# Patient Record
Sex: Female | Born: 1975 | Race: Black or African American | Hispanic: No | Marital: Married | Smoking: Current some day smoker
Health system: Southern US, Community
[De-identification: ages and names within clinical notes are randomized; demographics above are authoritative.]

## PROBLEM LIST (undated history)

## (undated) ENCOUNTER — Emergency Department (HOSPITAL_COMMUNITY): Admission: EM | Payer: PRIVATE HEALTH INSURANCE | Source: Home / Self Care

## (undated) DIAGNOSIS — S0285XA Fracture of orbit, unspecified, initial encounter for closed fracture: Secondary | ICD-10-CM

## (undated) DIAGNOSIS — E039 Hypothyroidism, unspecified: Secondary | ICD-10-CM

## (undated) HISTORY — PX: WISDOM TOOTH EXTRACTION: SHX21

---

## 2000-02-14 ENCOUNTER — Emergency Department (HOSPITAL_COMMUNITY): Admission: EM | Admit: 2000-02-14 | Discharge: 2000-02-14 | Payer: Self-pay | Admitting: Emergency Medicine

## 2000-02-17 ENCOUNTER — Emergency Department (HOSPITAL_COMMUNITY): Admission: EM | Admit: 2000-02-17 | Discharge: 2000-02-17 | Payer: Self-pay | Admitting: Emergency Medicine

## 2000-02-17 ENCOUNTER — Encounter: Payer: Self-pay | Admitting: Emergency Medicine

## 2001-02-06 ENCOUNTER — Emergency Department (HOSPITAL_COMMUNITY): Admission: EM | Admit: 2001-02-06 | Discharge: 2001-02-06 | Payer: Self-pay | Admitting: Emergency Medicine

## 2001-02-11 ENCOUNTER — Emergency Department (HOSPITAL_COMMUNITY): Admission: EM | Admit: 2001-02-11 | Discharge: 2001-02-12 | Payer: Self-pay | Admitting: Emergency Medicine

## 2002-11-23 ENCOUNTER — Emergency Department (HOSPITAL_COMMUNITY): Admission: EM | Admit: 2002-11-23 | Discharge: 2002-11-23 | Payer: Self-pay | Admitting: Emergency Medicine

## 2002-12-30 ENCOUNTER — Encounter: Admission: RE | Admit: 2002-12-30 | Discharge: 2003-01-27 | Payer: Self-pay | Admitting: Family Medicine

## 2002-12-30 ENCOUNTER — Encounter: Admission: RE | Admit: 2002-12-30 | Discharge: 2002-12-30 | Payer: Self-pay | Admitting: Family Medicine

## 2002-12-30 ENCOUNTER — Encounter: Payer: Self-pay | Admitting: Family Medicine

## 2003-03-13 ENCOUNTER — Emergency Department (HOSPITAL_COMMUNITY): Admission: EM | Admit: 2003-03-13 | Discharge: 2003-03-13 | Payer: Self-pay | Admitting: Emergency Medicine

## 2003-03-13 ENCOUNTER — Encounter: Payer: Self-pay | Admitting: Emergency Medicine

## 2004-08-28 ENCOUNTER — Emergency Department (HOSPITAL_COMMUNITY): Admission: AC | Admit: 2004-08-28 | Discharge: 2004-08-28 | Payer: Self-pay

## 2005-08-07 ENCOUNTER — Emergency Department (HOSPITAL_COMMUNITY): Admission: EM | Admit: 2005-08-07 | Discharge: 2005-08-07 | Payer: Self-pay | Admitting: Emergency Medicine

## 2005-09-29 ENCOUNTER — Emergency Department (HOSPITAL_COMMUNITY): Admission: EM | Admit: 2005-09-29 | Discharge: 2005-09-29 | Payer: Self-pay | Admitting: Family Medicine

## 2005-10-23 ENCOUNTER — Encounter (HOSPITAL_COMMUNITY): Admission: RE | Admit: 2005-10-23 | Discharge: 2005-12-19 | Payer: Self-pay | Admitting: Endocrinology

## 2005-11-03 ENCOUNTER — Ambulatory Visit (HOSPITAL_COMMUNITY): Admission: RE | Admit: 2005-11-03 | Discharge: 2005-11-03 | Payer: Self-pay | Admitting: Endocrinology

## 2006-07-05 ENCOUNTER — Emergency Department (HOSPITAL_COMMUNITY): Admission: EM | Admit: 2006-07-05 | Discharge: 2006-07-05 | Payer: Self-pay | Admitting: Emergency Medicine

## 2007-03-31 ENCOUNTER — Emergency Department (HOSPITAL_COMMUNITY): Admission: EM | Admit: 2007-03-31 | Discharge: 2007-03-31 | Payer: Self-pay | Admitting: Emergency Medicine

## 2009-01-17 ENCOUNTER — Emergency Department (HOSPITAL_COMMUNITY): Admission: EM | Admit: 2009-01-17 | Discharge: 2009-01-17 | Payer: Self-pay | Admitting: Emergency Medicine

## 2010-09-05 ENCOUNTER — Emergency Department (HOSPITAL_COMMUNITY)
Admission: EM | Admit: 2010-09-05 | Discharge: 2010-09-05 | Payer: Self-pay | Source: Home / Self Care | Admitting: Emergency Medicine

## 2010-11-21 LAB — URINALYSIS, ROUTINE W REFLEX MICROSCOPIC
Bilirubin Urine: NEGATIVE
Glucose, UA: NEGATIVE mg/dL
Ketones, ur: NEGATIVE mg/dL
Leukocytes, UA: NEGATIVE
Nitrite: NEGATIVE
Protein, ur: NEGATIVE mg/dL
Specific Gravity, Urine: 1.025 (ref 1.005–1.030)
Urobilinogen, UA: 0.2 mg/dL (ref 0.0–1.0)
pH: 6.5 (ref 5.0–8.0)

## 2010-11-21 LAB — CBC
HCT: 37.5 % (ref 36.0–46.0)
Hemoglobin: 12.2 g/dL (ref 12.0–15.0)
MCH: 29.8 pg (ref 26.0–34.0)
MCHC: 32.5 g/dL (ref 30.0–36.0)
MCV: 91.5 fL (ref 78.0–100.0)
Platelets: 210 10*3/uL (ref 150–400)
RBC: 4.1 MIL/uL (ref 3.87–5.11)
RDW: 15.6 % — ABNORMAL HIGH (ref 11.5–15.5)
WBC: 9.6 10*3/uL (ref 4.0–10.5)

## 2010-11-21 LAB — COMPREHENSIVE METABOLIC PANEL
ALT: 21 U/L (ref 0–35)
AST: 33 U/L (ref 0–37)
Albumin: 4.7 g/dL (ref 3.5–5.2)
Alkaline Phosphatase: 55 U/L (ref 39–117)
BUN: 8 mg/dL (ref 6–23)
CO2: 25 mEq/L (ref 19–32)
Calcium: 9.5 mg/dL (ref 8.4–10.5)
Chloride: 102 mEq/L (ref 96–112)
Creatinine, Ser: 0.85 mg/dL (ref 0.4–1.2)
GFR calc Af Amer: >60 mL/min (ref >60)
GFR calc non Af Amer: >60 mL/min (ref >60)
Glucose, Bld: 155 mg/dL — ABNORMAL HIGH (ref 70–99)
Potassium: 3.5 mEq/L (ref 3.5–5.1)
Sodium: 141 mEq/L (ref 135–145)
Total Bilirubin: 0.8 mg/dL (ref 0.3–1.2)
Total Protein: 8.6 g/dL — ABNORMAL HIGH (ref 6.0–8.3)

## 2010-11-21 LAB — URINE MICROSCOPIC-ADD ON

## 2010-11-21 LAB — DIFFERENTIAL
Basophils Absolute: 0 10*3/uL (ref 0.0–0.1)
Basophils Relative: 0 % (ref 0–1)
Eosinophils Absolute: 0 10*3/uL (ref 0.0–0.7)
Eosinophils Relative: 0 % (ref 0–5)
Lymphocytes Relative: 7 % — ABNORMAL LOW (ref 12–46)
Lymphs Abs: 0.7 10*3/uL (ref 0.7–4.0)
Monocytes Absolute: 0.7 10*3/uL (ref 0.1–1.0)
Monocytes Relative: 7 % (ref 3–12)
Neutro Abs: 8.2 10*3/uL — ABNORMAL HIGH (ref 1.7–7.7)
Neutrophils Relative %: 86 % — ABNORMAL HIGH (ref 43–77)

## 2010-11-21 LAB — LIPASE, BLOOD: Lipase: 23 U/L (ref 11–59)

## 2010-11-21 LAB — POCT PREGNANCY, URINE: Preg Test, Ur: NEGATIVE

## 2011-01-05 ENCOUNTER — Emergency Department (HOSPITAL_COMMUNITY)
Admission: EM | Admit: 2011-01-05 | Discharge: 2011-01-06 | Disposition: A | Discharge status: Home / Self Care | Payer: Self-pay | Attending: Emergency Medicine | Admitting: Emergency Medicine

## 2011-01-05 DIAGNOSIS — R109 Unspecified abdominal pain: Secondary | ICD-10-CM | POA: Insufficient documentation

## 2011-01-05 DIAGNOSIS — R112 Nausea with vomiting, unspecified: Secondary | ICD-10-CM | POA: Insufficient documentation

## 2011-01-05 DIAGNOSIS — R197 Diarrhea, unspecified: Secondary | ICD-10-CM | POA: Insufficient documentation

## 2011-01-05 LAB — URINALYSIS, ROUTINE W REFLEX MICROSCOPIC
Bilirubin Urine: NEGATIVE
Glucose, UA: NEGATIVE mg/dL
Leukocytes, UA: NEGATIVE
Protein, ur: 100 mg/dL — AB
Specific Gravity, Urine: 1.026 (ref 1.005–1.030)
pH: 6 (ref 5.0–8.0)

## 2011-01-05 LAB — URINE MICROSCOPIC-ADD ON

## 2011-01-05 LAB — POCT PREGNANCY, URINE: Preg Test, Ur: NEGATIVE

## 2011-01-06 LAB — COMPREHENSIVE METABOLIC PANEL
ALT: 19 U/L (ref 0–35)
AST: 25 U/L (ref 0–37)
Albumin: 4 g/dL (ref 3.5–5.2)
Alkaline Phosphatase: 50 U/L (ref 39–117)
BUN: 7 mg/dL (ref 6–23)
Calcium: 8.2 mg/dL — ABNORMAL LOW (ref 8.4–10.5)
Chloride: 110 mEq/L (ref 96–112)
Creatinine, Ser: 0.75 mg/dL (ref 0.4–1.2)
GFR calc Af Amer: >60 mL/min (ref >60)
Glucose, Bld: 116 mg/dL — ABNORMAL HIGH (ref 70–99)
Potassium: 4.1 mEq/L (ref 3.5–5.1)
Sodium: 138 mEq/L (ref 135–145)
Total Protein: 7 g/dL (ref 6.0–8.3)

## 2011-01-06 LAB — DIFFERENTIAL
Basophils Relative: 0 % (ref 0–1)
Eosinophils Absolute: 0 10*3/uL (ref 0.0–0.7)
Eosinophils Relative: 0 % (ref 0–5)
Lymphocytes Relative: 7 % — ABNORMAL LOW (ref 12–46)
Lymphs Abs: 0.5 10*3/uL — ABNORMAL LOW (ref 0.7–4.0)
Monocytes Absolute: 0.4 10*3/uL (ref 0.1–1.0)
Monocytes Relative: 5 % (ref 3–12)
Neutro Abs: 7.1 10*3/uL (ref 1.7–7.7)
Neutrophils Relative %: 88 % — ABNORMAL HIGH (ref 43–77)

## 2011-01-06 LAB — LIPASE, BLOOD: Lipase: 26 U/L (ref 11–59)

## 2011-01-06 LAB — CBC
HCT: 34.2 % — ABNORMAL LOW (ref 36.0–46.0)
MCHC: 33.6 g/dL (ref 30.0–36.0)
MCV: 93.7 fL (ref 78.0–100.0)
Platelets: 230 10*3/uL (ref 150–400)
RDW: 13.5 % (ref 11.5–15.5)
WBC: 8 10*3/uL (ref 4.0–10.5)

## 2011-03-29 ENCOUNTER — Emergency Department (HOSPITAL_COMMUNITY)
Admission: EM | Admit: 2011-03-29 | Discharge: 2011-03-29 | Disposition: A | Discharge status: Home / Self Care | Payer: Self-pay | Attending: Emergency Medicine | Admitting: Emergency Medicine

## 2011-03-29 DIAGNOSIS — E860 Dehydration: Secondary | ICD-10-CM | POA: Insufficient documentation

## 2011-03-29 DIAGNOSIS — R1013 Epigastric pain: Secondary | ICD-10-CM | POA: Insufficient documentation

## 2011-03-29 DIAGNOSIS — R112 Nausea with vomiting, unspecified: Secondary | ICD-10-CM | POA: Insufficient documentation

## 2011-03-29 DIAGNOSIS — K5289 Other specified noninfective gastroenteritis and colitis: Secondary | ICD-10-CM | POA: Insufficient documentation

## 2011-03-29 DIAGNOSIS — R197 Diarrhea, unspecified: Secondary | ICD-10-CM | POA: Insufficient documentation

## 2011-03-29 LAB — DIFFERENTIAL
Basophils Absolute: 0 10*3/uL (ref 0.0–0.1)
Basophils Relative: 0 % (ref 0–1)
Eosinophils Absolute: 0 10*3/uL (ref 0.0–0.7)
Eosinophils Relative: 0 % (ref 0–5)
Lymphocytes Relative: 8 % — ABNORMAL LOW (ref 12–46)
Lymphs Abs: 0.8 10*3/uL (ref 0.7–4.0)
Monocytes Absolute: 0.4 10*3/uL (ref 0.1–1.0)
Monocytes Relative: 4 % (ref 3–12)
Neutrophils Relative %: 88 % — ABNORMAL HIGH (ref 43–77)

## 2011-03-29 LAB — URINALYSIS, ROUTINE W REFLEX MICROSCOPIC
Bilirubin Urine: NEGATIVE
Glucose, UA: NEGATIVE mg/dL
Ketones, ur: NEGATIVE mg/dL
Leukocytes, UA: NEGATIVE
Nitrite: NEGATIVE
Specific Gravity, Urine: 1.025 (ref 1.005–1.030)
Urobilinogen, UA: 0.2 mg/dL (ref 0.0–1.0)
pH: 7 (ref 5.0–8.0)

## 2011-03-29 LAB — LIPASE, BLOOD: Lipase: 10 U/L — ABNORMAL LOW (ref 11–59)

## 2011-03-29 LAB — COMPREHENSIVE METABOLIC PANEL
AST: 36 U/L (ref 0–37)
Albumin: 4.7 g/dL (ref 3.5–5.2)
BUN: 8 mg/dL (ref 6–23)
Chloride: 101 mEq/L (ref 96–112)
Creatinine, Ser: 0.65 mg/dL (ref 0.50–1.10)
Total Protein: 8.9 g/dL — ABNORMAL HIGH (ref 6.0–8.3)

## 2011-03-29 LAB — CBC
HCT: 36.7 % (ref 36.0–46.0)
MCHC: 34.1 g/dL (ref 30.0–36.0)
MCV: 92.7 fL (ref 78.0–100.0)
Platelets: 340 10*3/uL (ref 150–400)
RDW: 13.7 % (ref 11.5–15.5)
WBC: 9.7 10*3/uL (ref 4.0–10.5)

## 2011-03-29 LAB — URINE MICROSCOPIC-ADD ON

## 2011-03-29 LAB — POCT PREGNANCY, URINE: Preg Test, Ur: NEGATIVE

## 2011-09-20 ENCOUNTER — Ambulatory Visit: Payer: Self-pay

## 2011-11-15 ENCOUNTER — Encounter (HOSPITAL_COMMUNITY): Payer: Self-pay | Admitting: Emergency Medicine

## 2011-11-15 ENCOUNTER — Emergency Department (HOSPITAL_COMMUNITY)
Admission: EM | Admit: 2011-11-15 | Discharge: 2011-11-15 | Disposition: A | Discharge status: Home / Self Care | Payer: Self-pay | Attending: Emergency Medicine | Admitting: Emergency Medicine

## 2011-11-15 DIAGNOSIS — R112 Nausea with vomiting, unspecified: Secondary | ICD-10-CM | POA: Insufficient documentation

## 2011-11-15 DIAGNOSIS — R197 Diarrhea, unspecified: Secondary | ICD-10-CM | POA: Insufficient documentation

## 2011-11-15 DIAGNOSIS — R109 Unspecified abdominal pain: Secondary | ICD-10-CM | POA: Insufficient documentation

## 2011-11-15 LAB — DIFFERENTIAL
Basophils Absolute: 0 10*3/uL (ref 0.0–0.1)
Basophils Relative: 0 % (ref 0–1)
Eosinophils Absolute: 0 10*3/uL (ref 0.0–0.7)
Eosinophils Relative: 0 % (ref 0–5)
Lymphocytes Relative: 10 % — ABNORMAL LOW (ref 12–46)
Lymphs Abs: 0.7 10*3/uL (ref 0.7–4.0)
Monocytes Absolute: 0.4 10*3/uL (ref 0.1–1.0)
Monocytes Relative: 6 % (ref 3–12)
Neutro Abs: 5.9 10*3/uL (ref 1.7–7.7)
Neutrophils Relative %: 85 % — ABNORMAL HIGH (ref 43–77)

## 2011-11-15 LAB — BASIC METABOLIC PANEL
BUN: 13 mg/dL (ref 6–23)
CO2: 23 mEq/L (ref 19–32)
Calcium: 10.2 mg/dL (ref 8.4–10.5)
Chloride: 101 mEq/L (ref 96–112)
Creatinine, Ser: 0.73 mg/dL (ref 0.50–1.10)
GFR calc Af Amer: >90 mL/min (ref >90)
GFR calc non Af Amer: >90 mL/min (ref >90)
Glucose, Bld: 125 mg/dL — ABNORMAL HIGH (ref 70–99)
Potassium: 4.1 mEq/L (ref 3.5–5.1)
Sodium: 140 mEq/L (ref 135–145)

## 2011-11-15 LAB — URINALYSIS, ROUTINE W REFLEX MICROSCOPIC
Bilirubin Urine: NEGATIVE
Glucose, UA: NEGATIVE mg/dL
Ketones, ur: 40 mg/dL — AB
Leukocytes, UA: NEGATIVE
Nitrite: NEGATIVE
Protein, ur: >300 mg/dL — AB
Specific Gravity, Urine: 1.027 (ref 1.005–1.030)
Urobilinogen, UA: 0.2 mg/dL (ref 0.0–1.0)
pH: 6 (ref 5.0–8.0)

## 2011-11-15 LAB — CBC
HCT: 37.6 % (ref 36.0–46.0)
Hemoglobin: 12.7 g/dL (ref 12.0–15.0)
MCH: 31.7 pg (ref 26.0–34.0)
MCHC: 33.8 g/dL (ref 30.0–36.0)
MCV: 93.8 fL (ref 78.0–100.0)
Platelets: 232 10*3/uL (ref 150–400)
RBC: 4.01 MIL/uL (ref 3.87–5.11)
RDW: 13.7 % (ref 11.5–15.5)
WBC: 7 10*3/uL (ref 4.0–10.5)

## 2011-11-15 LAB — URINE MICROSCOPIC-ADD ON

## 2011-11-15 LAB — POCT PREGNANCY, URINE: Preg Test, Ur: NEGATIVE

## 2011-11-15 MED ORDER — ONDANSETRON HCL 4 MG/2ML IJ SOLN
4.0000 mg | Freq: Once | INTRAMUSCULAR | Status: AC
  Administered 2011-11-15: 4 mg via INTRAVENOUS
  Filled 2011-11-15: qty 2

## 2011-11-15 MED ORDER — SODIUM CHLORIDE 0.9 % IV BOLUS (SEPSIS)
1000.0000 mL | Freq: Once | INTRAVENOUS | Status: AC
  Administered 2011-11-15: 1000 mL via INTRAVENOUS

## 2011-11-15 MED ORDER — MORPHINE SULFATE 4 MG/ML IJ SOLN
INTRAMUSCULAR | Status: AC
  Administered 2011-11-15: 4 mg via INTRAVENOUS
  Filled 2011-11-15: qty 1

## 2011-11-15 MED ORDER — MORPHINE SULFATE 4 MG/ML IJ SOLN
4.0000 mg | Freq: Once | INTRAMUSCULAR | Status: AC
  Administered 2011-11-15: 4 mg via INTRAVENOUS

## 2011-11-15 MED ORDER — ONDANSETRON HCL 4 MG PO TABS: 4.0000 mg | ORAL_TABLET | Freq: Four times a day (QID) | ORAL | Status: AC

## 2011-11-15 NOTE — Discharge Instructions (Signed)
B.R.A.T. Diet Your doctor has recommended the B.R.A.T. diet for you or your child until the condition improves. This is often used to help control diarrhea and vomiting symptoms. If you or your child can tolerate clear liquids, you may have:  Bananas.   Rice.   Applesauce.   Toast (and other simple starches such as crackers, potatoes, noodles).  Be sure to avoid dairy products, meats, and fatty foods until symptoms are better. Fruit juices such as apple, grape, and prune juice can make diarrhea worse. Avoid these. Continue this diet for 2 days or as instructed by your caregiver.Nausea and Vomiting Nausea is a sick feeling that often comes before throwing up (vomiting). Vomiting is a reflex where stomach contents come out of your mouth. Vomiting can cause severe loss of body fluids (dehydration). Children and elderly adults can become dehydrated quickly, especially if they also have diarrhea. Nausea and vomiting are symptoms of a condition or disease. It is important to find the cause of your symptoms. CAUSES   Direct irritation of the stomach lining. This irritation can result from increased acid production (gastroesophageal reflux disease), infection, food poisoning, taking certain medicines (such as nonsteroidal anti-inflammatory drugs), alcohol use, or tobacco use.   Signals from the brain.These signals could be caused by a headache, heat exposure, an inner ear disturbance, increased pressure in the brain from injury, infection, a tumor, or a concussion, pain, emotional stimulus, or metabolic problems.   An obstruction in the gastrointestinal tract (bowel obstruction).   Illnesses such as diabetes, hepatitis, gallbladder problems, appendicitis, kidney problems, cancer, sepsis, atypical symptoms of a heart attack, or eating disorders.   Medical treatments such as chemotherapy and radiation.   Receiving medicine that makes you sleep (general anesthetic) during surgery.  DIAGNOSIS Your  caregiver may ask for tests to be done if the problems do not improve after a few days. Tests may also be done if symptoms are severe or if the reason for the nausea and vomiting is not clear. Tests may include:  Urine tests.   Blood tests.   Stool tests.   Cultures (to look for evidence of infection).   X-rays or other imaging studies.  Test results can help your caregiver make decisions about treatment or the need for additional tests. TREATMENT You need to stay well hydrated. Drink frequently but in small amounts.You may wish to drink water, sports drinks, clear broth, or eat frozen ice pops or gelatin dessert to help stay hydrated.When you eat, eating slowly may help prevent nausea.There are also some antinausea medicines that may help prevent nausea. HOME CARE INSTRUCTIONS   Take all medicine as directed by your caregiver.   If you do not have an appetite, do not force yourself to eat. However, you must continue to drink fluids.   If you have an appetite, eat a normal diet unless your caregiver tells you differently.   Eat a variety of complex carbohydrates (rice, wheat, potatoes, bread), lean meats, yogurt, fruits, and vegetables.   Avoid high-fat foods because they are more difficult to digest.   Drink enough water and fluids to keep your urine clear or pale yellow.   If you are dehydrated, ask your caregiver for specific rehydration instructions. Signs of dehydration may include:   Severe thirst.   Dry lips and mouth.   Dizziness.   Dark urine.   Decreasing urine frequency and amount.   Confusion.   Rapid breathing or pulse.  SEEK IMMEDIATE MEDICAL CARE IF:   You have  blood or brown flecks (like coffee grounds) in your vomit.   You have black or bloody stools.   You have a severe headache or stiff neck.   You are confused.   You have severe abdominal pain.   You have chest pain or trouble breathing.   You do not urinate at least once every 8 hours.     You develop cold or clammy skin.   You continue to vomit for longer than 24 to 48 hours.   You have a fever.  MAKE SURE YOU:   Understand these instructions.   Will watch your condition.   Will get help right away if you are not doing well or get worse.  Document Released: 08/28/2005 Document Revised: 08/17/2011 Document Reviewed: 01/25/2011 [facility] Patient Information 2012 [place], [place].  RESOURCE GUIDE  Dental Problems  Patients with Medicaid: [facility] [phone] [address]                                           [phone] [address] Phone:  [phone]                                                  Phone:  [phone]  If unable to pay or uninsured, contact:  [facility] or [facility]. to become qualified for the adult dental clinic.  Chronic Pain Problems Contact [facility]  [phone] Patients need to be referred by their primary care doctor.  Insufficient Money for Medicine Contact [employer]:  call "211" or [facility] [phone].  No Primary Care Doctor Call [facility]  8310303065 Other agencies that provide inexpensive medical care    [facility]  [phone]    [facility]  [phone]    [facility]  [phone]    [facility]  [phone]    [facility]  [phone]    [facility]  [phone]  Psychological Services [facility]  [phone] [facility] Services  [phone] [facility]   [phone] (emergency services [phone])  Substance Abuse Resources [facility]  7127924066 [facility] [phone] [facility] [phone] Floydene Flock [phone] Residential & Outpatient Substance Abuse Program  [phone]  Abuse/Neglect [facility] Child Abuse Hotline 671-078-1431 [facility] Child Abuse Hotline 864-113-2738 (After  Hours)  Emergency Shelter [facility] ([phone]  Maternity Homes [facility] [place] [facility] [place] 8574002829 [facility] [phone]  MRSA Hotline #:   (928)616-4353    [facility] Resources  [facility]     [employer]                          [facility] [address] [place]                       [address]      [address]  [address]  Cristobal Goldmann Phone:  [phone]                                   Phone:  [phone]                 Phone:  [phone]  [facility] Mental Health Phone:  [phone]  [facility] Child Abuse Hotline 463-494-0057 [phone] (After Hours)   Document Released: 08/28/2005 Document Revised: 08/17/2011 Document Reviewed: 02/14/2007 [facility] Patient Information 2012 [place], [place].

## 2011-11-15 NOTE — ED Notes (Signed)
PT. REPORTS PERSISTENT VOMITTING WITH DIARRHEA AND MID ABDOMINAL CRAMPING ONSET THIS MORNING , DENIES FEVER OR CHILLS.

## 2011-11-15 NOTE — ED Notes (Signed)
Pt reports feeling better. No further emesis. Pain improved. Resting with eyes closed. S/o at bedside. Will continue to monitor.

## 2011-11-20 NOTE — ED Provider Notes (Signed)
History    36 year old female with vomiting and diarrhea. Onset of symptoms was morning. Nonbilious, nonbloody emesis and no blood in her stool. Mild diffuse crampy abdominal pain. No radiation. No appreciable aggravating or relieving factors. No sick contacts. No urinary complaints. Denies history of abdominal or pelvic surgery. No fevers or chills.     CSN: [id number]  Arrival date & time 11/15/11  0019   First MD Initiated Contact with Patient 11/15/11 0054      Chief Complaint  Patient presents with  . Emesis    (Consider location/radiation/quality/duration/timing/severity/associated sxs/prior treatment) HPI  History reviewed. No pertinent past medical history.  History reviewed. No pertinent past surgical history.  No family history on file.  History  Substance Use Topics  . Smoking status: Never Smoker   . Smokeless tobacco: Not on file  . Alcohol Use: Yes    OB History    Grav Para Term Preterm Abortions TAB SAB Ect Mult Living                  Review of Systems   Review of symptoms negative unless otherwise noted in HPI.   Allergies  Review of patient's allergies indicates no known allergies.  Home Medications   Current Outpatient Rx  Name Route Sig Dispense Refill  . ONDANSETRON HCL 4 MG PO TABS Oral Take 1 tablet (4 mg total) by mouth every 6 (six) hours. 12 tablet 0    BP 180/82  Pulse 58  Temp(Src) 98.7 F (37.1 C) (Oral)  Resp 19  SpO2 97%  LMP 11/07/2011  Physical Exam  Nursing note and vitals reviewed. Constitutional: She appears well-developed and well-nourished. No distress.       Sitting up in bed. No acute distress  HENT:  Head: Normocephalic and atraumatic.  Right Ear: External ear normal.  Left Ear: External ear normal.  Eyes: Conjunctivae are normal. Right eye exhibits no discharge. Left eye exhibits no discharge.  Neck: Neck supple.  Cardiovascular: Normal rate, regular rhythm and normal heart sounds.  Exam reveals no  gallop and no friction rub.   No murmur heard. Pulmonary/Chest: Effort normal and breath sounds normal. No respiratory distress.  Abdominal: Soft. She exhibits no distension. There is no tenderness.       Abdomen normal limits to inspection. There is no distention. Mild diffuse tenderness without guarding. There is no rebound tenderness.  Genitourinary:       No costovertebral angle tenderness  Musculoskeletal: She exhibits no edema and no tenderness.  Neurological: She is alert.  Skin: Skin is warm and dry. She is not diaphoretic.  Psychiatric: She has a normal mood and affect. Her behavior is normal. Thought content normal.    ED Course  Procedures (including critical care time)  Labs Reviewed  URINALYSIS, ROUTINE W REFLEX MICROSCOPIC - Abnormal; Notable for the following:    Hgb urine dipstick TRACE (*)    Ketones, ur 40 (*)    Protein, ur >300 (*)    All other components within normal limits  DIFFERENTIAL - Abnormal; Notable for the following:    Neutrophils Relative 85 (*)    Lymphocytes Relative 10 (*)    All other components within normal limits  BASIC METABOLIC PANEL - Abnormal; Notable for the following:    Glucose, Bld 125 (*)    All other components within normal limits  URINE MICROSCOPIC-ADD ON - Abnormal; Notable for the following:    Squamous Epithelial / LPF FEW (*)    Bacteria, UA  FEW (*)    All other components within normal limits  CBC  POCT PREGNANCY, URINE  LAB REPORT - SCANNED   No results found.   1. Nausea and vomiting in adult       MDM  36 year old female with vomiting and diarrhea. Benign abdominal examination. Symptom duration less than 24 hours and a relatively unremarkable workup. Patient reports symptoms much improved after IV fluids and antiemetics. Very low suspicion for surgical abdomen. Suspect viral illness. Return precautions were discussed. Plan continued symptomatic treatment. Outpatient followup as needed.       Raeford Razor, MD 11/20/11 1655

## 2014-01-07 ENCOUNTER — Encounter (HOSPITAL_COMMUNITY): Payer: Self-pay | Admitting: Emergency Medicine

## 2014-01-07 DIAGNOSIS — R1084 Generalized abdominal pain: Secondary | ICD-10-CM | POA: Insufficient documentation

## 2014-01-07 DIAGNOSIS — R030 Elevated blood-pressure reading, without diagnosis of hypertension: Secondary | ICD-10-CM | POA: Insufficient documentation

## 2014-01-07 DIAGNOSIS — M545 Low back pain, unspecified: Secondary | ICD-10-CM | POA: Insufficient documentation

## 2014-01-07 DIAGNOSIS — Z79899 Other long term (current) drug therapy: Secondary | ICD-10-CM | POA: Insufficient documentation

## 2014-01-07 DIAGNOSIS — R112 Nausea with vomiting, unspecified: Secondary | ICD-10-CM | POA: Insufficient documentation

## 2014-01-07 DIAGNOSIS — R197 Diarrhea, unspecified: Secondary | ICD-10-CM | POA: Insufficient documentation

## 2014-01-07 DIAGNOSIS — Z3202 Encounter for pregnancy test, result negative: Secondary | ICD-10-CM | POA: Insufficient documentation

## 2014-01-07 LAB — CBC WITH DIFFERENTIAL/PLATELET
BASOS ABS: 0 10*3/uL (ref 0.0–0.1)
Basophils Relative: 0 % (ref 0–1)
EOS PCT: 0 % (ref 0–5)
Eosinophils Absolute: 0 10*3/uL (ref 0.0–0.7)
HEMATOCRIT: 42.6 % (ref 36.0–46.0)
HEMOGLOBIN: 14 g/dL (ref 12.0–15.0)
LYMPHS ABS: 0.7 10*3/uL (ref 0.7–4.0)
LYMPHS PCT: 7 % — AB (ref 12–46)
MCH: 34 pg (ref 26.0–34.0)
MCHC: 32.9 g/dL (ref 30.0–36.0)
MCV: 103.4 fL — AB (ref 78.0–100.0)
MONO ABS: 0.5 10*3/uL (ref 0.1–1.0)
MONOS PCT: 5 % (ref 3–12)
Neutro Abs: 10 10*3/uL — ABNORMAL HIGH (ref 1.7–7.7)
Neutrophils Relative %: 88 % — ABNORMAL HIGH (ref 43–77)
Platelets: 232 10*3/uL (ref 150–400)
RBC: 4.12 MIL/uL (ref 3.87–5.11)
RDW: 12.3 % (ref 11.5–15.5)
WBC: 11.3 10*3/uL — AB (ref 4.0–10.5)

## 2014-01-07 LAB — COMPREHENSIVE METABOLIC PANEL
ALT: 31 U/L (ref 0–35)
AST: 45 U/L — ABNORMAL HIGH (ref 0–37)
Albumin: 4.4 g/dL (ref 3.5–5.2)
Alkaline Phosphatase: 51 U/L (ref 39–117)
BILIRUBIN TOTAL: 0.5 mg/dL (ref 0.3–1.2)
BUN: 12 mg/dL (ref 6–23)
CALCIUM: 9.3 mg/dL (ref 8.4–10.5)
CHLORIDE: 102 meq/L (ref 96–112)
CO2: 20 meq/L (ref 19–32)
CREATININE: 0.68 mg/dL (ref 0.50–1.10)
GLUCOSE: 149 mg/dL — AB (ref 70–99)
Potassium: 3.9 mEq/L (ref 3.7–5.3)
Sodium: 141 mEq/L (ref 137–147)
Total Protein: 8.2 g/dL (ref 6.0–8.3)

## 2014-01-07 LAB — LIPASE, BLOOD: LIPASE: 10 U/L — AB (ref 11–59)

## 2014-01-07 NOTE — ED Notes (Signed)
Pt presents to department for evaluation of nausea/vomiting and abdominal pain. Onset last night. 8/10 pain at the time. Pt is alert and oriented x4.

## 2014-01-08 ENCOUNTER — Emergency Department (HOSPITAL_COMMUNITY)
Admission: EM | Admit: 2014-01-08 | Discharge: 2014-01-08 | Disposition: A | Discharge status: Home / Self Care | Payer: 59 | Attending: Emergency Medicine | Admitting: Emergency Medicine

## 2014-01-08 DIAGNOSIS — IMO0001 Reserved for inherently not codable concepts without codable children: Secondary | ICD-10-CM

## 2014-01-08 DIAGNOSIS — R197 Diarrhea, unspecified: Secondary | ICD-10-CM

## 2014-01-08 DIAGNOSIS — R109 Unspecified abdominal pain: Secondary | ICD-10-CM

## 2014-01-08 DIAGNOSIS — R112 Nausea with vomiting, unspecified: Secondary | ICD-10-CM

## 2014-01-08 DIAGNOSIS — R030 Elevated blood-pressure reading, without diagnosis of hypertension: Secondary | ICD-10-CM

## 2014-01-08 LAB — URINALYSIS, ROUTINE W REFLEX MICROSCOPIC
Bilirubin Urine: NEGATIVE
Glucose, UA: NEGATIVE mg/dL
KETONES UR: NEGATIVE mg/dL
LEUKOCYTES UA: NEGATIVE
Nitrite: NEGATIVE
PROTEIN: 100 mg/dL — AB
Specific Gravity, Urine: 1.031 — ABNORMAL HIGH (ref 1.005–1.030)
UROBILINOGEN UA: 0.2 mg/dL (ref 0.0–1.0)
pH: 6 (ref 5.0–8.0)

## 2014-01-08 LAB — URINE MICROSCOPIC-ADD ON

## 2014-01-08 LAB — POC URINE PREG, ED: PREG TEST UR: NEGATIVE

## 2014-01-08 MED ORDER — SODIUM CHLORIDE 0.9 % IV BOLUS (SEPSIS)
1000.0000 mL | Freq: Once | INTRAVENOUS | Status: AC
  Administered 2014-01-08: 1000 mL via INTRAVENOUS

## 2014-01-08 MED ORDER — ONDANSETRON HCL 4 MG/2ML IJ SOLN
4.0000 mg | Freq: Once | INTRAMUSCULAR | Status: AC
  Administered 2014-01-08: 4 mg via INTRAVENOUS
  Filled 2014-01-08: qty 2

## 2014-01-08 MED ORDER — MORPHINE SULFATE 4 MG/ML IJ SOLN
4.0000 mg | Freq: Once | INTRAMUSCULAR | Status: AC
  Administered 2014-01-08: 4 mg via INTRAVENOUS
  Filled 2014-01-08: qty 1

## 2014-01-08 MED ORDER — ONDANSETRON 4 MG PO TBDP: 4.0000 mg | ORAL_TABLET | Freq: Three times a day (TID) | ORAL | Status: DC | PRN

## 2014-01-08 MED ORDER — KETOROLAC TROMETHAMINE 30 MG/ML IJ SOLN
30.0000 mg | Freq: Once | INTRAMUSCULAR | Status: AC
  Administered 2014-01-08: 30 mg via INTRAVENOUS
  Filled 2014-01-08: qty 1

## 2014-01-08 NOTE — ED Notes (Signed)
Discharge and follow up instructions reviewed. Pt verbalized understanding.  

## 2014-01-08 NOTE — ED Notes (Signed)
Pt given sprite to drink. 

## 2014-01-08 NOTE — ED Provider Notes (Signed)
CSN: [id number]     Arrival date & time 01/07/14  1843 History   None    Chief Complaint  Patient presents with  . Emesis  . Abdominal Pain   HPI  Kristin Wiley is a 38 y.o. female with no PMH who presents to the ED for evaluation of vomiting and abdominal pain. History was provided by the patient. Patient states she developed nausea and vomiting yesterday with multiple episodes of vomiting and diarrhea over the past two days. No hematochezia or hematemesis. She states that she developed diffuse gradually worsening lower cramping abdominal pain last night after her nausea and vomiting began. The patient also has lower back pain diffusely. Pain come and goes in waves. Nothing makes her pain better/worse. Took an Ibuprofen with some improvements in her pain. Has had similar pain with menstrual cramping, however, "this is worse." She started her menstrual cycle today. Denies vaginal discharge, dysuria, hematuria, genital sores, or flank pain. No known sick contacts. No previous abdominal surgeries. Patient denies being sexually active currently. No new sexual partners. No fever, chills, chest pain, shortness of breath, cough, rhinorrhea, sore throat, headaches, dizziness, lightheadedness, or leg swelling.   History reviewed. No pertinent past medical history. History reviewed. No pertinent past surgical history. No family history on file. History  Substance Use Topics  . Smoking status: Never Smoker   . Smokeless tobacco: Not on file  . Alcohol Use: Yes   OB History   Grav Para Term Preterm Abortions TAB SAB Ect Mult Living                  Review of Systems  Constitutional: Negative for fever, chills, activity change, appetite change and fatigue.  HENT: Negative for congestion and sore throat.   Respiratory: Negative for cough and shortness of breath.   Cardiovascular: Negative for chest pain and leg swelling.  Gastrointestinal: Positive for nausea, vomiting, abdominal pain and  diarrhea. Negative for constipation, blood in stool and rectal pain.  Genitourinary: Positive for vaginal bleeding (menses). Negative for dysuria, urgency, frequency, hematuria, flank pain, decreased urine volume, vaginal discharge, difficulty urinating, genital sores, vaginal pain, menstrual problem, pelvic pain and dyspareunia.  Musculoskeletal: Positive for back pain. Negative for gait problem, myalgias and neck pain.  Skin: Negative for color change and wound.  Neurological: Negative for dizziness, weakness, light-headedness and headaches.    Allergies  Review of patient's allergies indicates no known allergies.  Home Medications   Prior to Admission medications   Medication Sig Start Date End Date Taking? Authorizing Provider  levothyroxine (SYNTHROID, LEVOTHROID) 88 MCG tablet Take 88 mcg by mouth daily before breakfast.   Yes Historical Provider, MD   BP 173/98  Pulse 57  Temp(Src) 98.2 F (36.8 C) (Oral)  Resp 18  SpO2 100%  Filed Vitals:   01/08/14 0345 01/08/14 0415 01/08/14 0445 01/08/14 0521  BP: 154/77 166/82 158/92 162/85  Pulse: 49 62 60 61  Temp:    98.2 F (36.8 C)  TempSrc:      Resp:    20  SpO2: 99% 99% 97% 100%    Physical Exam  Nursing note and vitals reviewed. Constitutional: She is oriented to person, place, and time. She appears well-developed and well-nourished. No distress.  HENT:  Head: Normocephalic and atraumatic.  Right Ear: External ear normal.  Left Ear: External ear normal.  Mouth/Throat: Oropharynx is clear and moist.  Eyes: Conjunctivae are normal. Right eye exhibits no discharge. Left eye exhibits no discharge.  Neck: Normal range of motion. Neck supple.  Cardiovascular: Normal rate, regular rhythm and normal heart sounds.  Exam reveals no gallop and no friction rub.   No murmur heard. Pulmonary/Chest: Effort normal and breath sounds normal. No respiratory distress. She has no wheezes. She has no rales. She exhibits no tenderness.   Abdominal: Soft. Bowel sounds are normal. She exhibits no distension and no mass. There is no tenderness. There is no rebound and no guarding.  Musculoskeletal: Normal range of motion. She exhibits no edema and no tenderness.  No CVA, lumbar, or flank tenderness bilaterally.   Neurological: She is alert and oriented to person, place, and time.  Skin: Skin is dry. She is not diaphoretic.    ED Course  Procedures (including critical care time) Labs Review Labs Reviewed  CBC WITH DIFFERENTIAL - Abnormal; Notable for the following:    WBC 11.3 (*)    MCV 103.4 (*)    Neutrophils Relative % 88 (*)    Neutro Abs 10.0 (*)    Lymphocytes Relative 7 (*)    All other components within normal limits  COMPREHENSIVE METABOLIC PANEL - Abnormal; Notable for the following:    Glucose, Bld 149 (*)    AST 45 (*)    All other components within normal limits  LIPASE, BLOOD - Abnormal; Notable for the following:    Lipase 10 (*)    All other components within normal limits  URINALYSIS, ROUTINE W REFLEX MICROSCOPIC  POC URINE PREG, ED    Imaging Review No results found.   EKG Interpretation None      Results for orders placed during the hospital encounter of 01/08/14  CBC WITH DIFFERENTIAL      Result Value Ref Range   WBC 11.3 (*) 4.0 - 10.5 K/uL   RBC 4.12  3.87 - 5.11 MIL/uL   Hemoglobin 14.0  12.0 - 15.0 g/dL   HCT 16.1  09.6 - 04.5 %   MCV 103.4 (*) 78.0 - 100.0 fL   MCH 34.0  26.0 - 34.0 pg   MCHC 32.9  30.0 - 36.0 g/dL   RDW 40.9  81.1 - 91.4 %   Platelets 232  150 - 400 K/uL   Neutrophils Relative % 88 (*) 43 - 77 %   Neutro Abs 10.0 (*) 1.7 - 7.7 K/uL   Lymphocytes Relative 7 (*) 12 - 46 %   Lymphs Abs 0.7  0.7 - 4.0 K/uL   Monocytes Relative 5  3 - 12 %   Monocytes Absolute 0.5  0.1 - 1.0 K/uL   Eosinophils Relative 0  0 - 5 %   Eosinophils Absolute 0.0  0.0 - 0.7 K/uL   Basophils Relative 0  0 - 1 %   Basophils Absolute 0.0  0.0 - 0.1 K/uL  COMPREHENSIVE METABOLIC  PANEL      Result Value Ref Range   Sodium 141  137 - 147 mEq/L   Potassium 3.9  3.7 - 5.3 mEq/L   Chloride 102  96 - 112 mEq/L   CO2 20  19 - 32 mEq/L   Glucose, Bld 149 (*) 70 - 99 mg/dL   BUN 12  6 - 23 mg/dL   Creatinine, Ser 7.82  0.50 - 1.10 mg/dL   Calcium 9.3  8.4 - 95.6 mg/dL   Total Protein 8.2  6.0 - 8.3 g/dL   Albumin 4.4  3.5 - 5.2 g/dL   AST 45 (*) 0 - 37 U/L   ALT 31  0 - 35 U/L   Alkaline Phosphatase 51  39 - 117 U/L   Total Bilirubin 0.5  0.3 - 1.2 mg/dL   GFR calc non Af Amer >90  >90 mL/min   GFR calc Af Amer >90  >90 mL/min  LIPASE, BLOOD      Result Value Ref Range   Lipase 10 (*) 11 - 59 U/L  URINALYSIS, ROUTINE W REFLEX MICROSCOPIC      Result Value Ref Range   Color, Urine YELLOW  YELLOW   APPearance HAZY (*) CLEAR   Specific Gravity, Urine 1.031 (*) 1.005 - 1.030   pH 6.0  5.0 - 8.0   Glucose, UA NEGATIVE  NEGATIVE mg/dL   Hgb urine dipstick LARGE (*) NEGATIVE   Bilirubin Urine NEGATIVE  NEGATIVE   Ketones, ur NEGATIVE  NEGATIVE mg/dL   Protein, ur 161100 (*) NEGATIVE mg/dL   Urobilinogen, UA 0.2  0.0 - 1.0 mg/dL   Nitrite NEGATIVE  NEGATIVE   Leukocytes, UA NEGATIVE  NEGATIVE  URINE MICROSCOPIC-ADD ON      Result Value Ref Range   Squamous Epithelial / LPF RARE  RARE   WBC, UA 3-6  <3 WBC/hpf   RBC / HPF 3-6  <3 RBC/hpf   Bacteria, UA FEW (*) RARE  POC URINE PREG, ED      Result Value Ref Range   Preg Test, Ur NEGATIVE  NEGATIVE     MDM   Kristin Wiley is a 38 y.o. female with no PMH who presents to the ED for evaluation of vomiting and abdominal pain.   Rechecks  1:40 AM = Pain improved. Abdominal pain almost resolved. Repeat abdominal exam benign. Awaiting results of UA.  2:39 AM = Pain almost resolved. Still awaiting results of UA.  4:30 AM = Mild pain. No nausea or other concerns. Mild "menstrual cramping pain" from "my period." Ordering 30 mg Toradol. Repeat abdominal exam benign.  5:11 AM = Pain resolved. Asking for something to eat  and drink which was provided.  5:20 AM = Patient tolerating PO fluids without difficulty. No pain.     Etiology of nausea, vomiting, diarrhea, and abdominal pain possibly due to gastroenteritis vs menstrual cramping. Patient afebrile and non-toxic in appearance. Abdominal exam benign. Labs revealed mild leukocytosis (11.3), however, this may be reactionary. Also elevated AST (45), however, this may be due to hemolysis. Patient's abdominal pain resolved throughout her ED visit. Able to tolerate fluids without difficulty or emesis. Patient's BP elevated during ED visit. No hx of HTN. Instructed patient to follow-up with PCP regarding this. Return precautions, discharge instructions, and follow-up was discussed with the patient before discharge.     Discharge Medication List as of 01/08/2014  5:16 AM    START taking these medications   Details  ondansetron (ZOFRAN ODT) 4 MG disintegrating tablet Take 1 tablet (4 mg total) by mouth every 8 (eight) hours as needed for nausea., Starting 01/08/2014, Until Discontinued, Print         Final impressions: 1. Nausea vomiting and diarrhea   2. Abdominal pain   3. Elevated blood pressure       Greer EeJessica Katlin Ruta Capece PA-C           Jillyn LedgerJessica K Quinteria Chis[place], [place] JerseyPA-C 01/08/14 1836

## 2014-01-08 NOTE — Discharge Instructions (Signed)
Take zofran for nausea and vomiting  Drink plenty of fluids and rest  Small frequent meals - bland diet  Your blood pressure was elevated during your ED visit - please follow-up with a doctor regarding this  Return to the emergency department if you develop any changing/worsening condition, fever, blood in your stool/vomit, feeling lightheaded/dizzy, repeated vomiting, or any other concerns (please read additional information regarding your condition below)   Abdominal Pain, Adult Many things can cause abdominal pain. Usually, abdominal pain is not caused by a disease and will improve without treatment. It can often be observed and treated at home. Your health care provider will do a physical exam and possibly order blood tests and X-rays to help determine the seriousness of your pain. However, in many cases, more time must pass before a clear cause of the pain can be found. Before that point, your health care provider may not know if you need more testing or further treatment. HOME CARE INSTRUCTIONS  Monitor your abdominal pain for any changes. The following actions may help to alleviate any discomfort you are experiencing:  Only take over-the-counter or prescription medicines as directed by your health care provider.  Do not take laxatives unless directed to do so by your health care provider.  Try a clear liquid diet (broth, tea, or water) as directed by your health care provider. Slowly move to a bland diet as tolerated. SEEK MEDICAL CARE IF:  You have unexplained abdominal pain.  You have abdominal pain associated with nausea or diarrhea.  You have pain when you urinate or have a bowel movement.  You experience abdominal pain that wakes you in the night.  You have abdominal pain that is worsened or improved by eating food.  You have abdominal pain that is worsened with eating fatty foods. SEEK IMMEDIATE MEDICAL CARE IF:   Your pain does not go away within 2 hours.  You have a  fever.  You keep throwing up (vomiting).  Your pain is felt only in portions of the abdomen, such as the right side or the left lower portion of the abdomen.  You pass bloody or black tarry stools. MAKE SURE YOU:  Understand these instructions.   Will watch your condition.   Will get help right away if you are not doing well or get worse.  Document Released: 06/07/2005 Document Revised: 06/18/2013 Document Reviewed: 05/07/2013 [facility] Patient Information 2014 [place], [place].  Nausea and Vomiting Nausea is a sick feeling that often comes before throwing up (vomiting). Vomiting is a reflex where stomach contents come out of your mouth. Vomiting can cause severe loss of body fluids (dehydration). Children and elderly adults can become dehydrated quickly, especially if they also have diarrhea. Nausea and vomiting are symptoms of a condition or disease. It is important to find the cause of your symptoms. CAUSES  Direct irritation of the stomach lining. This irritation can result from increased acid production (gastroesophageal reflux disease), infection, food poisoning, taking certain medicines (such as nonsteroidal anti-inflammatory drugs), alcohol use, or tobacco use. Signals from the brain.These signals could be caused by a headache, heat exposure, an inner ear disturbance, increased pressure in the brain from injury, infection, a tumor, or a concussion, pain, emotional stimulus, or metabolic problems. An obstruction in the gastrointestinal tract (bowel obstruction). Illnesses such as diabetes, hepatitis, gallbladder problems, appendicitis, kidney problems, cancer, sepsis, atypical symptoms of a heart attack, or eating disorders. Medical treatments such as chemotherapy and radiation. Receiving medicine that makes you sleep (general  anesthetic) during surgery. DIAGNOSIS Your caregiver may ask for tests to be done if the problems do not improve after a few days. Tests may also be done if  symptoms are severe or if the reason for the nausea and vomiting is not clear. Tests may include: Urine tests. Blood tests. Stool tests. Cultures (to look for evidence of infection). X-rays or other imaging studies. Test results can help your caregiver make decisions about treatment or the need for additional tests. TREATMENT You need to stay well hydrated. Drink frequently but in small amounts.You may wish to drink water, sports drinks, clear broth, or eat frozen ice pops or gelatin dessert to help stay hydrated.When you eat, eating slowly may help prevent nausea.There are also some antinausea medicines that may help prevent nausea. HOME CARE INSTRUCTIONS  Take all medicine as directed by your caregiver. If you do not have an appetite, do not force yourself to eat. However, you must continue to drink fluids. If you have an appetite, eat a normal diet unless your caregiver tells you differently. Eat a variety of complex carbohydrates (rice, wheat, potatoes, bread), lean meats, yogurt, fruits, and vegetables. Avoid high-fat foods because they are more difficult to digest. Drink enough water and fluids to keep your urine clear or pale yellow. If you are dehydrated, ask your caregiver for specific rehydration instructions. Signs of dehydration may include: Severe thirst. Dry lips and mouth. Dizziness. Dark urine. Decreasing urine frequency and amount. Confusion. Rapid breathing or pulse. SEEK IMMEDIATE MEDICAL CARE IF:  You have blood or brown flecks (like coffee grounds) in your vomit. You have black or bloody stools. You have a severe headache or stiff neck. You are confused. You have severe abdominal pain. You have chest pain or trouble breathing. You do not urinate at least once every 8 hours. You develop cold or clammy skin. You continue to vomit for longer than 24 to 48 hours. You have a fever. MAKE SURE YOU:  Understand these instructions. Will watch your condition. Will  get help right away if you are not doing well or get worse. Document Released: 08/28/2005 Document Revised: 11/20/2011 Document Reviewed: 01/25/2011 [facility] Patient Information 2014 [place], [place].  Hypertension As your heart beats, it forces blood through your arteries. This force is your blood pressure. If the pressure is too high, it is called hypertension (HTN) or high blood pressure. HTN is dangerous because you may have it and not know it. High blood pressure may mean that your heart has to work harder to pump blood. Your arteries may be narrow or stiff. The extra work puts you at risk for heart disease, stroke, and other problems.  Blood pressure consists of two numbers, a higher number over a lower, 110/72, for example. It is stated as "110 over 72." The ideal is below 120 for the top number (systolic) and under 80 for the bottom (diastolic). Write down your blood pressure today. You should pay close attention to your blood pressure if you have certain conditions such as: Heart failure. Prior heart attack. Diabetes Chronic kidney disease. Prior stroke. Multiple risk factors for heart disease. To see if you have HTN, your blood pressure should be measured while you are seated with your arm held at the level of the heart. It should be measured at least twice. A one-time elevated blood pressure reading (especially in the Emergency Department) does not mean that you need treatment. There may be conditions in which the blood pressure is different between your right and  left arms. It is important to see your caregiver soon for a recheck. Most people have essential hypertension which means that there is not a specific cause. This type of high blood pressure may be lowered by changing lifestyle factors such as: Stress. Smoking. Lack of exercise. Excessive weight. Drug/tobacco/alcohol use. Eating less salt. Most people do not have symptoms from high blood pressure until it has caused damage to  the body. Effective treatment can often prevent, delay or reduce that damage. TREATMENT  When a cause has been identified, treatment for high blood pressure is directed at the cause. There are a large number of medications to treat HTN. These fall into several categories, and your caregiver will help you select the medicines that are best for you. Medications may have side effects. You should review side effects with your caregiver. If your blood pressure stays high after you have made lifestyle changes or started on medicines,  Your medication(s) may need to be changed. Other problems may need to be addressed. Be certain you understand your prescriptions, and know how and when to take your medicine. Be sure to follow up with your caregiver within the time frame advised (usually within two weeks) to have your blood pressure rechecked and to review your medications. If you are taking more than one medicine to lower your blood pressure, make sure you know how and at what times they should be taken. Taking two medicines at the same time can result in blood pressure that is too low. SEEK IMMEDIATE MEDICAL CARE IF: You develop a severe headache, blurred or changing vision, or confusion. You have unusual weakness or numbness, or a faint feeling. You have severe chest or abdominal pain, vomiting, or breathing problems. MAKE SURE YOU:  Understand these instructions. Will watch your condition. Will get help right away if you are not doing well or get worse. Document Released: 08/28/2005 Document Revised: 11/20/2011 Document Reviewed: 04/17/2008 [facility] Patient Information 2014 [place], [place].   Emergency Department Resource Guide 1) Find a Doctor and Pay Out of Pocket Although you won't have to find out who is covered by your insurance plan, it is a good idea to ask around and get recommendations. You will then need to call the office and see if the doctor you have chosen will accept you as a new  patient and what types of options they offer for patients who are self-pay. Some doctors offer discounts or will set up payment plans for their patients who do not have insurance, but you will need to ask so you aren't surprised when you get to your appointment.  2) Contact Your Local Health Department Not all health departments have doctors that can see patients for sick visits, but many do, so it is worth a call to see if yours does. If you don't know where your local health department is, you can check in your phone book. The CDC also has a tool to help you locate your state's health department, and many state websites also have listings of all of their local health departments.  3) Find a Walk-in Clinic If your illness is not likely to be very severe or complicated, you may want to try a walk in clinic. These are popping up all over the country in pharmacies, drugstores, and shopping centers. They're usually staffed by nurse practitioners or physician assistants that have been trained to treat common illnesses and complaints. They're usually fairly quick and inexpensive. However, if you have serious medical issues or chronic  medical problems, these are probably not your best option.  No Primary Care Doctor: - Call Health Connect at  [phone] - they can help you locate a primary care doctor that  accepts your insurance, provides certain services, etc. - Physician Referral Service- [phone]  Chronic Pain Problems: Organization         Address  Phone   Notes  [facility]  [phone] Patients need to be referred by their primary care doctor.   Medication Assistance: Organization         Address  Phone   Notes  [facility] Medication [facility] [address] [place], [place] [address] [phone] --Must be a resident of [facility] [place] -- Must have NO insurance coverage whatsoever (no Medicaid/ Medicare, etc.) -- The pt. MUST have a primary  care doctor that directs their care regularly and follows them in the community   MedAssist  [phone]   [employer]  [phone]    Agencies that provide inexpensive medical care: Organization         Address  Phone   Notes  [facility]  [phone]   [facility]    [phone]   [facility] [address] [place], [place] [address] [phone]   [facility] [address], [place] [phone]) [phone]   [facility]anned Parenthood    [phone]) [phone]   [facility]    [phone]) [phone]   [facility] and [facility]  [address], [place] Phone:  [phone], Fax:  [phone] Hours of Operation:  9 am - 6 pm, M-F.  Also accepts Medicaid/Medicare and self-pay.  [facility]  [address], [place] Phone: [phone], Fax: [phone]. Hours of Operation:  8:30 am - 5:30 pm, M-F.  Also accepts Medicaid and self-pay.  [facility] [place] [address], [place] Phone: [phone]) [phone]   [facility] [address], [place], [place] [phone], Ext. [phone] Mondays & Thursdays: 7-9 AM.  First 15 patients are seen on a first come, first serve basis.    Medicaid-accepting [facility] Providers:  Organization         Address  Phone   Notes  [facility] [address], [place] [phone] Also accepts self-pay patients.  [facility] [address] [place], [place]  [phone]   [facility] [address], [place] [phone]   [facility] [address] [place] [address], [place] [phone]   Renaye RakersVeita Bland [address], [place]   [phone] Only accepts [place] Access [place] patients after they have their name applied to their card.   Self-Pay (no insurance) in [facility]  [place]:  Organization         Address  Phone   Notes  Sickle Cell Patients, [facility] [address], [place] [phone]   [facility] [address], [place] [phone]   [facility] [place]  [address], [place] [phone]   [facility]/Dr. Osei-Bonsu  [address] [place] [address], [place] or [phone], [place] [phone] Phone number for both [place] and [place] locations is the same.  Urgent  Medical and [facility] [address], [place] [phone]   [facility] [address], [place] or [address] [phone]   [facility] [address], [place] [phone], phone; [phone], fax Sees patients 1st and 3rd Saturday of every month.  Must not qualify for public or private insurance (i.e. Medicaid, Medicare, [place] Health Choice, Veterans' Benefits)  Household income should be no more than 200% of the poverty level The clinic cannot treat you if you are pregnant or think you are pregnant  Sexually transmitted diseases are not treated at the clinic.    Dental Care: Organization         Address  Phone  Notes  [facility] Department of [facility] [address] [place], [place] [phone] Accepts children up to age 22 who are enrolled in [place] or [place] Health Choice; pregnant women with a Medicaid card; and children who have applied for Medicaid or [place] Health Choice, but were declined, whose parents can pay a reduced fee at time of service.  [facility] Department of [facility]  [address], [place] [phone] Accepts children up to age 7 who are enrolled in [place] or [place] Health Choice; pregnant women with a Medicaid card; and children who have applied for Medicaid or [place] Health Choice, but were declined, whose parents can  pay a reduced fee at time of service.  [facility]  [address] [place], [place] [phone] Patients are seen by appointment only. Walk-ins are not accepted. [facility] will see patients 51 years of age and older. Monday - Tuesday (8am-5pm) Most Wednesdays (8:30-5pm) $30 per visit, cash only  [facility] Access PRO[facility]  [address], [facility] [phone] Patients are seen by appointment only. Walk-ins are not accepted. [facility] will see patients 22 years of age and older. One Wednesday Evening (Monthly: Volunteer Based).  $30 per visit, cash only  [employer]  [phone] for adults; Children under age 69, call Graduate Pediatric Dentistry at [phone]. Children aged 17-14, please call [phone] to request a pediatric application.  Dental services are provided in all areas of dental care including fillings, crowns and bridges, complete and partial dentures, implants, gum treatment, root canals, and extractions. Preventive care is also provided. Treatment is provided to both adults and children. Patients are selected via a lottery and there is often a waiting list.   [facility] [address] [place]  [phone] [email or web address]   [facility] [address] [place], [place] [phone], Ext. [phone] Second and Fourth Thursday of each month, opens at 6:30 AM; Clinic ends at 9 AM.  Patients are seen on a first-come first-served basis, and a limited number are seen during each clinic.   [facility]  [address] [place], [place] [phone]   Eligibility Requirements You must have lived in [place], [place], or [place] counties for at least the last three months.   You cannot be eligible for state or federal sponsored [place], including [employer], [place], or [employer].   You generally cannot be eligible for healthcare  insurance through your employer.    How to apply: Eligibility screenings are held every Tuesday and Wednesday afternoon from 1:00 pm until 4:00 pm. You do not need an appointment for the interview!  [facility] [address], [place], [place] [phone]   [facility] Health Department  [phone]   [facility]  [phone]   [facility] Health Department  [phone]    Behavioral Health Resources in the Community: Intensive Outpatient Programs Organization         Address  Phone  Notes  [facility] [address], [place], [place] [phone]   [facility] [address] [place], [place] [phone]   [facility] [address] [place], [place]  [phone]   [facility] Mental Health [address]  [place], [place] [phone] or [phone]   Substance Abuse Resources Organization         Address  Phone  Notes  Al[facility] Drug Services  [phone]   [facility]  [phone]   [facility] [place]  [phone]   Floydene Flock  [phone]   Residential & Outpatient Substance Abuse Program  [phone]   Psychological Services Organization         Address  Phone  Notes  [facility] Health  [phone]   [facility]  [phone]   [facility] Health [address], [place] [phone] or [phone]    Mobile Crisis Teams Organization         Address  Phone  Notes  [facility], Mobile Crisis Care Unit  [phone]   [facility]  [address] [place], [place] [phone]   Doristine Locks [address] [place] [phone]    Self-Help/Support Groups Organization         Address  Phone             Notes  [facility] of [place] - variety of support groups  [phone]- I7437963 Call for more information  Narcotics Anonymous (NA),  [facility] [address], [employer] [place]  2 meetings at this location   Statistician         Address  Phone  Notes  [facility] [address],    [place]  [phone]   [facility]  [address], [place] [address], [place], [place] [phone]   [facility] Treatment [facility] [address] [place], [place] [phone] Admissions: 8am-3pm M-F  [facility] [address],    [place], [place] [phone]   [facility] [address] [place], [place], [place] [phone]   [facility] [address]  [place], [place] [phone]   [facility] - Intensive Outpatient [address], [place], [place] [phone]   [facility] (Addiction Recovery Care [facility] [address] [place]  [place], [place] [phone] or [phone]   [facility] (RTS) [address], [place], [place] [phone] Accepts Medicaid  [place] [address]  [place] [phone] Substance Abuse/Addiction Treatment   [facility] Organization         Address  Phone  Notes  [facility]  [phone]   Angie Fava, PhD [address] Ervin Knack [place], [place]   [phone] or [phone]   [facility]   [address] [place], [place] [phone]   [facility] [address] [place], [place] [phone] Insurance/Medicaid/sponsorship through [employer] and Families [address]  [place], [place] [phone] [place], [place] [phone]    Dr. Adele Schilder  [phone]   [facility] Dept. 1) [address] [place] 2) [place] 3)  [address], [place] [phone]  [phone]    [place] [phone] or [phone] (After Hours)

## 2014-01-08 NOTE — ED Notes (Signed)
Pt c/o abd pain and n/v that started last night. Pt denies sx onset after eating anything, pt also states she has been around sick ppl but they have not had the same sx as her. Pt rates abd pain 8/10.

## 2014-01-09 NOTE — ED Provider Notes (Signed)
Medical screening examination/treatment/procedure(s) were performed by non-physician practitioner and as supervising physician I was immediately available for consultation/collaboration.   EKG Interpretation None        Baneen Wieseler M Sharley Keeler, MD 01/09/14 0754 

## 2014-03-03 ENCOUNTER — Inpatient Hospital Stay (HOSPITAL_COMMUNITY)
Admission: AD | Admit: 2014-03-03 | Discharge: 2014-03-03 | Disposition: A | Discharge status: Home / Self Care | Payer: 59 | Source: Ambulatory Visit | Attending: Obstetrics and Gynecology | Admitting: Obstetrics and Gynecology

## 2014-03-03 ENCOUNTER — Encounter (HOSPITAL_COMMUNITY): Payer: Self-pay

## 2014-03-03 DIAGNOSIS — F17200 Nicotine dependence, unspecified, uncomplicated: Secondary | ICD-10-CM | POA: Insufficient documentation

## 2014-03-03 DIAGNOSIS — N921 Excessive and frequent menstruation with irregular cycle: Secondary | ICD-10-CM | POA: Insufficient documentation

## 2014-03-03 DIAGNOSIS — N390 Urinary tract infection, site not specified: Secondary | ICD-10-CM

## 2014-03-03 DIAGNOSIS — Z202 Contact with and (suspected) exposure to infections with a predominantly sexual mode of transmission: Secondary | ICD-10-CM

## 2014-03-03 DIAGNOSIS — E039 Hypothyroidism, unspecified: Secondary | ICD-10-CM | POA: Insufficient documentation

## 2014-03-03 DIAGNOSIS — N920 Excessive and frequent menstruation with regular cycle: Secondary | ICD-10-CM

## 2014-03-03 DIAGNOSIS — N923 Ovulation bleeding: Secondary | ICD-10-CM

## 2014-03-03 DIAGNOSIS — R300 Dysuria: Secondary | ICD-10-CM | POA: Insufficient documentation

## 2014-03-03 DIAGNOSIS — R109 Unspecified abdominal pain: Secondary | ICD-10-CM | POA: Insufficient documentation

## 2014-03-03 HISTORY — DX: Hypothyroidism, unspecified: E03.9

## 2014-03-03 LAB — URINALYSIS, ROUTINE W REFLEX MICROSCOPIC
BILIRUBIN URINE: NEGATIVE
Glucose, UA: NEGATIVE mg/dL
Ketones, ur: NEGATIVE mg/dL
NITRITE: NEGATIVE
PH: 7 (ref 5.0–8.0)
Protein, ur: 100 mg/dL — AB
UROBILINOGEN UA: 0.2 mg/dL (ref 0.0–1.0)

## 2014-03-03 LAB — WET PREP, GENITAL
Clue Cells Wet Prep HPF POC: NONE SEEN
Trich, Wet Prep: NONE SEEN
YEAST WET PREP: NONE SEEN

## 2014-03-03 LAB — URINE MICROSCOPIC-ADD ON

## 2014-03-03 LAB — POCT PREGNANCY, URINE: PREG TEST UR: NEGATIVE

## 2014-03-03 MED ORDER — AZITHROMYCIN 1 G PO PACK
1.0000 g | PACK | Freq: Once | ORAL | Status: AC
Start: 2014-03-03 — End: 2014-03-03
  Administered 2014-03-03: 1 g via ORAL
  Filled 2014-03-03: qty 1

## 2014-03-03 MED ORDER — CEFTRIAXONE SODIUM 250 MG IJ SOLR
250.0000 mg | Freq: Once | INTRAMUSCULAR | Status: AC
  Administered 2014-03-03: 250 mg via INTRAMUSCULAR
  Filled 2014-03-03: qty 250

## 2014-03-03 MED ORDER — CIPROFLOXACIN HCL 500 MG PO TABS: 500.0000 mg | ORAL_TABLET | Freq: Two times a day (BID) | ORAL | Status: DC

## 2014-03-03 NOTE — Discharge Instructions (Signed)
Gonorrhea Gonorrhea is an infection that can cause serious problems. If left untreated, may   Damage the female or female organs.   Cause women to be unable to have children (sterility).   Harm a fetus, if the infected woman is pregnant.  It is important to get treatment for gonorrhea as soon as possible. It is also necessary that all your sexual partners be tested for the infection.  CAUSES  Gonorrhea is caused by bacteria called Neisseria gonorrhoeae. The infection is spread from person to person, usually by sexual contact (such as by anal, vaginal, or oral means). A newborn can contract the infection from his or her mother during birth.  SYMPTOMS  Some people with gonorrhea do not have symptoms. Symptoms may be different in females and males.  Females The most common symptoms are:   Pain in the lower abdomen.   Fever with or without chills.  Other symptoms include:   Abnormal vaginal discharge.   Painful intercourse.   Burning or itching of the vagina or lips of the vagina.   Abnormal vaginal bleeding.   Pain when urinating.   Long-lasting (chronic) pain in the lower abdomen, especially during menstruation or intercourse.   Inability to become pregnant.   Going into premature labor.   Irritation, pain, bleeding, or discharge from the rectum. This may occur if the infection was spread by anal sex.   Sore throat or swollen neck lymph nodes. This may occur if the infection was spread by oral sex.  Males The most common symptoms are:   Discharge from the penis.   Pain or burning during urination.   Pain or swelling in the testicles. Other symptoms may include:   Irritation, pain, bleeding, or discharge from the rectum. This may occur if the infection was spread by anal sex.   Sore throat, fever, or swollen neck lymph nodes. This may occur if the infection was spread by oral sex.  DIAGNOSIS  A diagnosis is made after a physical exam is done and a  sample of discharge is examined under a microscope for the presence of the bacteria. The discharge may be taken from the urethra, cervix, throat, or rectum.  TREATMENT  Gonorrhea is treated with antibiotic medicines. It is important for treatment to begin as soon as possible. Early treatment may prevent some problems from developing.  HOME CARE INSTRUCTIONS   Only take over-the-counter or prescription medicines for pain, fever, or discomfort as directed by your health care provider.   Take antibiotics as directed. Make sure you finish them even if you start to feel better. Incomplete treatment will put you at risk for continued infection.   Do not have sex until treatment is complete or as directed by your health care provider.   Follow up with your health care provider as directed.   Not all test results are available during your visit. If your test results are not back during the visit, make an appointment with your health care provider to find out the results. Do not assume everything is normal if you have not heard from your health care provider or the medical facility. It is important for you to follow up on all of your test results.   If you test positive for gonorrhea, inform your recent sexual partners. They need to be checked for gonorrhea even if they do not have symptoms. They may need treatment, even if they test negative for gonorrhea.  SEEK MEDICAL CARE IF:   You develop any bad  reaction to the medicine you were prescribed. This may include:   A rash.   Nausea.   Vomiting.   Diarrhea.   Your symptoms do not improve after a few days of taking antibiotics.   Your symptoms get worse.   You develop increased pain, such as in the testicles (for males) or in the abdomen (for females).  SEEK IMMEDIATE MEDICAL CARE IF:  You have a fever or persistent symptoms for more than 2-3 days.   You have a fever and your symptoms suddenly get worse.  MAKE SURE YOU:     Understand these instructions.  Will watch your condition.  Will get help right away if you are not doing well or get worse. Document Released: 08/25/2000 Document Revised: 06/18/2013 Document Reviewed: 03/05/2013 [facility] Patient Information 2015 [place]. This information is not intended to replace advice given to you by your health care provider. Make sure you discuss any questions you have with your health care provider.  Urinary Tract Infection Urinary tract infections (UTIs) can develop anywhere along your urinary tract. Your urinary tract is your body's drainage system for removing wastes and extra water. Your urinary tract includes two kidneys, two ureters, a bladder, and a urethra. Your kidneys are a pair of bean-shaped organs. Each kidney is about the size of your fist. They are located below your ribs, one on each side of your spine. CAUSES Infections are caused by microbes, which are microscopic organisms, including fungi, viruses, and bacteria. These organisms are so small that they can only be seen through a microscope. Bacteria are the microbes that most commonly cause UTIs. SYMPTOMS  Symptoms of UTIs may vary by age and gender of the patient and by the location of the infection. Symptoms in young women typically include a frequent and intense urge to urinate and a painful, burning feeling in the bladder or urethra during urination. Older women and men are more likely to be tired, shaky, and weak and have muscle aches and abdominal pain. A fever may mean the infection is in your kidneys. Other symptoms of a kidney infection include pain in your back or sides below the ribs, nausea, and vomiting. DIAGNOSIS To diagnose a UTI, your caregiver will ask you about your symptoms. Your caregiver also will ask to provide a urine sample. The urine sample will be tested for bacteria and white blood cells. White blood cells are made by your body to help fight infection. TREATMENT   Typically, UTIs can be treated with medication. Because most UTIs are caused by a bacterial infection, they usually can be treated with the use of antibiotics. The choice of antibiotic and length of treatment depend on your symptoms and the type of bacteria causing your infection. HOME CARE INSTRUCTIONS  If you were prescribed antibiotics, take them exactly as your caregiver instructs you. Finish the medication even if you feel better after you have only taken some of the medication.  Drink enough water and fluids to keep your urine clear or pale yellow.  Avoid caffeine, tea, and carbonated beverages. They tend to irritate your bladder.  Empty your bladder often. Avoid holding urine for long periods of time.  Empty your bladder before and after sexual intercourse.  After a bowel movement, women should cleanse from front to back. Use each tissue only once. SEEK MEDICAL CARE IF:   You have back pain.  You develop a fever.  Your symptoms do not begin to resolve within 3 days. SEEK IMMEDIATE MEDICAL CARE IF:  You have severe back pain or lower abdominal pain.  You develop chills.  You have nausea or vomiting.  You have continued burning or discomfort with urination. MAKE SURE YOU:   Understand these instructions.  Will watch your condition.  Will get help right away if you are not doing well or get worse. Document Released: 06/07/2005 Document Revised: 02/27/2012 Document Reviewed: 10/06/2011 [facility] Patient Information 2015 [place], [place]. This information is not intended to replace advice given to you by your health care provider. Make sure you discuss any questions you have with your health care provider.

## 2014-03-03 NOTE — MAU Provider Note (Signed)
Chief Complaint: Vaginal Bleeding   First Provider Initiated Contact with Patient 03/03/14 2115     SUBJECTIVE HPI: Kristin Wiley is a 38 y.o. [id number] female who presents with spotting, mild cramping and dysuria times a few days. Exposed to gonorrhea via same sex partner's sex toy. Partner tested positive for gonorrhea and was treated today. LMP-Jan 11 2014. Denies fever, chills, vaginal discharge, dyspareunia, urgency, frequency, flank pain.  Past Medical History  Diagnosis Date  . Hypothyroidism    OB History  Gravida Para Term Preterm AB SAB TAB Ectopic Multiple Living  2 1 1  1  1   1     # Outcome Date GA Lbr Len/2nd Weight Sex Delivery Anes PTL Lv  2 TAB           1 TRM      SVD   Y     History reviewed. No pertinent past surgical history. History   Social History  . Marital Status: Single    Spouse Name: N/A    Number of Children: N/A  . Years of Education: N/A   Occupational History  . Not on file.   Social History Main Topics  . Smoking status: Current Every Day Smoker  . Smokeless tobacco: Not on file  . Alcohol Use: Yes     Comment: occassionally  . Drug Use: No  . Sexual Activity: Yes    Birth Control/ Protection: None   Other Topics Concern  . Not on file   Social History Narrative  . No narrative on file   No current facility-administered medications on file prior to encounter.   Current Outpatient Prescriptions on File Prior to Encounter  Medication Sig Dispense Refill  . levothyroxine (SYNTHROID, LEVOTHROID) 88 MCG tablet Take 88 mcg by mouth daily before breakfast.       No Known Allergies  ROS: Pertinent items in HPI.  OBJECTIVE Blood pressure 117/80, pulse 70, temperature 98.7 F (37.1 C), temperature source Oral, resp. rate 18, height 5' 0.5" (1.537 m), weight 52.164 kg (115 lb), last menstrual period 01/11/2014, SpO2 98.00%. GENERAL: Well-developed, well-nourished female in no acute distress.  HEENT: Normocephalic HEART: normal  rate RESP: normal effort ABDOMEN: Soft, non-tender. No CVA tenderness. EXTREMITIES: Nontender, no edema NEURO: Alert and oriented SPECULUM EXAM: NEFG, small amount of blood noted, cervix clean BIMANUAL: cervix closed; uterus normal size, no adnexal tenderness or masses. No cervical motion tenderness.  LAB RESULTS Results for orders placed during the hospital encounter of 03/03/14 (from the past 24 hour(s))  URINALYSIS, ROUTINE W REFLEX MICROSCOPIC     Status: Abnormal   Collection Time    03/03/14  8:33 PM      Result Value Ref Range   Color, Urine RED (*) YELLOW   APPearance CLOUDY (*) CLEAR   Specific Gravity, Urine <1.005 (*) 1.005 - 1.030   pH 7.0  5.0 - 8.0   Glucose, UA NEGATIVE  NEGATIVE mg/dL   Hgb urine dipstick LARGE (*) NEGATIVE   Bilirubin Urine NEGATIVE  NEGATIVE   Ketones, ur NEGATIVE  NEGATIVE mg/dL   Protein, ur 161100 (*) NEGATIVE mg/dL   Urobilinogen, UA 0.2  0.0 - 1.0 mg/dL   Nitrite NEGATIVE  NEGATIVE   Leukocytes, UA SMALL (*) NEGATIVE  URINE MICROSCOPIC-ADD ON     Status: None   Collection Time    03/03/14  8:33 PM      Result Value Ref Range   Squamous Epithelial / LPF RARE  RARE   WBC,  UA 7-10  <3 WBC/hpf   RBC / HPF 21-50  <3 RBC/hpf   Bacteria, UA RARE  RARE  POCT PREGNANCY, URINE     Status: None   Collection Time    03/03/14  8:42 PM      Result Value Ref Range   Preg Test, Ur NEGATIVE  NEGATIVE  WET PREP, GENITAL     Status: Abnormal   Collection Time    03/03/14  9:20 PM      Result Value Ref Range   Yeast Wet Prep HPF POC NONE SEEN  NONE SEEN   Trich, Wet Prep NONE SEEN  NONE SEEN   Clue Cells Wet Prep HPF POC NONE SEEN  NONE SEEN   WBC, Wet Prep HPF POC FEW (*) NONE SEEN    IMAGING No results found.  MAU COURSE Rocephin and azithromycin given in maternity admissions.  ASSESSMENT 1. Exposure to gonorrhea   2. Intermenstrual spotting   3. UTI (lower urinary tract infection)     PLAN Discharge home in stable condition. Safe sex  practices reviewed. GC/CT cultures pending     Follow-up Information   Follow up with Gynecologist. (As needed if symptoms worsen)       [facility] (As needed in emergencies)    Contact information:   [address] [id number] [place] [id number] [phone]       Medication List         ciprofloxacin 500 MG tablet  Commonly known as:  CIPRO  Take 1 tablet (500 mg total) by mouth 2 (two) times daily.     ibuprofen 200 MG tablet  Commonly known as:  ADVIL,MOTRIN  Take 400 mg by mouth every 6 (six) hours as needed for cramping.     levothyroxine 88 MCG tablet  Commonly known as:  SYNTHROID, LEVOTHROID  Take 88 mcg by mouth daily before breakfast.       Dorathy KinsmanVirginia Smith, CNM 03/03/2014  10:17 PM

## 2014-03-03 NOTE — MAU Note (Addendum)
Pt states that she found out her partner cheated on her and may possibly have bee nexposed to gonorrhea. Started to have some vaginal spotting today and mild cramping. LMP-Jan 11 2014. Denies any abnormal discharge. Also has some complaints of burning with urination.

## 2014-03-04 LAB — GC/CHLAMYDIA PROBE AMP
CT Probe RNA: NEGATIVE
GC Probe RNA: NEGATIVE

## 2014-03-04 NOTE — MAU Provider Note (Signed)
Attestation of Attending Supervision of Advanced Practitioner (CNM/NP): Evaluation and management procedures were performed by the Advanced Practitioner under my supervision and collaboration.  I have reviewed the Advanced Practitioner's note and chart, and I agree with the management and plan.  CONSTANT,PEGGY 03/04/2014 10:59 AM   

## 2014-07-13 ENCOUNTER — Encounter (HOSPITAL_COMMUNITY): Payer: Self-pay

## 2014-12-02 ENCOUNTER — Emergency Department (HOSPITAL_COMMUNITY)
Admission: EM | Admit: 2014-12-02 | Discharge: 2014-12-02 | Disposition: A | Discharge status: Home / Self Care | Payer: Self-pay | Attending: Emergency Medicine | Admitting: Emergency Medicine

## 2014-12-02 ENCOUNTER — Encounter (HOSPITAL_COMMUNITY): Payer: Self-pay

## 2014-12-02 DIAGNOSIS — R112 Nausea with vomiting, unspecified: Secondary | ICD-10-CM | POA: Insufficient documentation

## 2014-12-02 DIAGNOSIS — Z792 Long term (current) use of antibiotics: Secondary | ICD-10-CM | POA: Insufficient documentation

## 2014-12-02 DIAGNOSIS — R1013 Epigastric pain: Secondary | ICD-10-CM | POA: Insufficient documentation

## 2014-12-02 DIAGNOSIS — Z79899 Other long term (current) drug therapy: Secondary | ICD-10-CM | POA: Insufficient documentation

## 2014-12-02 DIAGNOSIS — R197 Diarrhea, unspecified: Secondary | ICD-10-CM | POA: Insufficient documentation

## 2014-12-02 DIAGNOSIS — Z720 Tobacco use: Secondary | ICD-10-CM | POA: Insufficient documentation

## 2014-12-02 DIAGNOSIS — E039 Hypothyroidism, unspecified: Secondary | ICD-10-CM | POA: Insufficient documentation

## 2014-12-02 LAB — CBC WITH DIFFERENTIAL/PLATELET
Basophils Absolute: 0 10*3/uL (ref 0.0–0.1)
Basophils Relative: 0 % (ref 0–1)
EOS PCT: 0 % (ref 0–5)
Eosinophils Absolute: 0 10*3/uL (ref 0.0–0.7)
HEMATOCRIT: 42.4 % (ref 36.0–46.0)
HEMOGLOBIN: 14.5 g/dL (ref 12.0–15.0)
LYMPHS PCT: 12 % (ref 12–46)
Lymphs Abs: 1 10*3/uL (ref 0.7–4.0)
MCH: 32.6 pg (ref 26.0–34.0)
MCHC: 34.2 g/dL (ref 30.0–36.0)
MCV: 95.3 fL (ref 78.0–100.0)
MONO ABS: 0.4 10*3/uL (ref 0.1–1.0)
Monocytes Relative: 4 % (ref 3–12)
NEUTROS ABS: 7.2 10*3/uL (ref 1.7–7.7)
NEUTROS PCT: 84 % — AB (ref 43–77)
Platelets: 243 10*3/uL (ref 150–400)
RBC: 4.45 MIL/uL (ref 3.87–5.11)
RDW: 12.3 % (ref 11.5–15.5)
WBC: 8.6 10*3/uL (ref 4.0–10.5)

## 2014-12-02 LAB — COMPREHENSIVE METABOLIC PANEL
ALBUMIN: 4.8 g/dL (ref 3.5–5.2)
ALT: 33 U/L (ref 0–35)
ANION GAP: 14 (ref 5–15)
AST: 25 U/L (ref 0–37)
Alkaline Phosphatase: 60 U/L (ref 39–117)
BILIRUBIN TOTAL: 0.8 mg/dL (ref 0.3–1.2)
BUN: 16 mg/dL (ref 6–23)
CO2: 26 mmol/L (ref 19–32)
Calcium: 9.3 mg/dL (ref 8.4–10.5)
Chloride: 98 mmol/L (ref 96–112)
Creatinine, Ser: 0.73 mg/dL (ref 0.50–1.10)
GFR calc Af Amer: >90 mL/min (ref >90)
Glucose, Bld: 128 mg/dL — ABNORMAL HIGH (ref 70–99)
POTASSIUM: 3.5 mmol/L (ref 3.5–5.1)
SODIUM: 138 mmol/L (ref 135–145)
Total Protein: 8.5 g/dL — ABNORMAL HIGH (ref 6.0–8.3)

## 2014-12-02 LAB — LIPASE, BLOOD: Lipase: 18 U/L (ref 11–59)

## 2014-12-02 MED ORDER — GI COCKTAIL ~~LOC~~
30.0000 mL | Freq: Once | ORAL | Status: AC
  Administered 2014-12-02: 30 mL via ORAL
  Filled 2014-12-02: qty 30

## 2014-12-02 MED ORDER — SODIUM CHLORIDE 0.9 % IV BOLUS (SEPSIS)
1000.0000 mL | INTRAVENOUS | Status: AC
  Administered 2014-12-02: 1000 mL via INTRAVENOUS

## 2014-12-02 MED ORDER — ONDANSETRON 4 MG PO TBDP: ORAL_TABLET | ORAL | Status: DC

## 2014-12-02 MED ORDER — HYDROMORPHONE HCL 1 MG/ML IJ SOLN
0.5000 mg | Freq: Once | INTRAMUSCULAR | Status: AC
  Administered 2014-12-02: 0.5 mg via INTRAVENOUS
  Filled 2014-12-02: qty 1

## 2014-12-02 MED ORDER — OXYCODONE-ACETAMINOPHEN 5-325 MG PO TABS: 1.0000 | ORAL_TABLET | ORAL | Status: DC | PRN

## 2014-12-02 MED ORDER — FENTANYL CITRATE 0.05 MG/ML IJ SOLN
50.0000 ug | Freq: Once | INTRAMUSCULAR | Status: AC
  Administered 2014-12-02: 50 ug via INTRAVENOUS
  Filled 2014-12-02: qty 2

## 2014-12-02 NOTE — ED Provider Notes (Signed)
CSN: [id number]     Arrival date & time 12/02/14  0820 History   First MD Initiated Contact with Patient 12/02/14 [phone]     Chief Complaint  Patient presents with  . Abdominal Pain  . Emesis     (Consider location/radiation/quality/duration/timing/severity/associated sxs/prior Treatment) Patient is a 39 y.o. female presenting with vomiting. The history is provided by the patient.  Emesis Severity:  Moderate Duration:  4 days Timing:  Intermittent Quality:  Stomach contents Able to tolerate:  Liquids Progression:  Unchanged Chronicity:  New Recent urination:  Normal Relieved by:  Nothing Worsened by:  Nothing tried Ineffective treatments:  None tried Associated symptoms: abdominal pain and diarrhea   Associated symptoms: no headaches   Abdominal pain:    Location:  Epigastric   Quality:  Burning   Severity:  Mild   Onset quality:  Gradual Diarrhea:    Quality:  Watery   Severity:  Mild   Timing:  Rare   Progression:  Unchanged   Past Medical History  Diagnosis Date  . Hypothyroidism    History reviewed. No pertinent past surgical history. History reviewed. No pertinent family history. History  Substance Use Topics  . Smoking status: Current Some Day Smoker    Types: Cigarettes  . Smokeless tobacco: Never Used  . Alcohol Use: Yes     Comment: occassionally   OB History    Gravida Para Term Preterm AB TAB SAB Ectopic Multiple Living   2 1 1  1 1    1      Review of Systems  Constitutional: Negative for fever and fatigue.  HENT: Negative for congestion and drooling.   Eyes: Negative for pain.  Respiratory: Negative for cough and shortness of breath.   Cardiovascular: Negative for chest pain.  Gastrointestinal: Positive for nausea, vomiting, abdominal pain and diarrhea.  Genitourinary: Negative for dysuria and hematuria.  Musculoskeletal: Negative for back pain, gait problem and neck pain.  Skin: Negative for color change.  Neurological: Negative for  dizziness and headaches.  Hematological: Negative for adenopathy.  Psychiatric/Behavioral: Negative for behavioral problems.  All other systems reviewed and are negative.     Allergies  Review of patient's allergies indicates no known allergies.  Home Medications   Prior to Admission medications   Medication Sig Start Date End Date Taking? Authorizing Provider  ciprofloxacin (CIPRO) 500 MG tablet Take 1 tablet (500 mg total) by mouth 2 (two) times daily. 03/03/14   Dorathy KinsmanVirginia Smith, CNM  ibuprofen (ADVIL,MOTRIN) 200 MG tablet Take 400 mg by mouth every 6 (six) hours as needed for cramping.    Historical Provider, MD  levothyroxine (SYNTHROID, LEVOTHROID) 88 MCG tablet Take 88 mcg by mouth daily before breakfast.    Historical Provider, MD   BP 198/96 mmHg  Pulse 74  Temp(Src) 98.2 F (36.8 C) (Oral)  Resp 18  Ht 5' (1.524 m)  Wt 130 lb (58.968 kg)  BMI 25.39 kg/m2  SpO2 97%  LMP 11/10/2014 Physical Exam  Constitutional: She is oriented to person, place, and time. She appears well-developed and well-nourished.  HENT:  Head: Normocephalic and atraumatic.  Mouth/Throat: Oropharynx is clear and moist. No oropharyngeal exudate.  Eyes: Conjunctivae and EOM are normal. Pupils are equal, round, and reactive to light.  Neck: Normal range of motion. Neck supple.  Cardiovascular: Normal rate, regular rhythm, normal heart sounds and intact distal pulses.  Exam reveals no gallop and no friction rub.   No murmur heard. Pulmonary/Chest: Effort normal and breath sounds normal. No  respiratory distress. She has no wheezes.  Abdominal: Soft. Bowel sounds are normal. There is tenderness (mild epig ttp). There is no rebound and no guarding.  Musculoskeletal: Normal range of motion. She exhibits no edema or tenderness.  Neurological: She is alert and oriented to person, place, and time.  Skin: Skin is warm and dry.  Psychiatric: She has a normal mood and affect. Her behavior is normal.  Nursing  note and vitals reviewed.   ED Course  Procedures (including critical care time) Labs Review Labs Reviewed  CBC WITH DIFFERENTIAL/PLATELET - Abnormal; Notable for the following:    Neutrophils Relative % 84 (*)    All other components within normal limits  COMPREHENSIVE METABOLIC PANEL - Abnormal; Notable for the following:    Glucose, Bld 128 (*)    Total Protein 8.5 (*)    All other components within normal limits  LIPASE, BLOOD  POC URINE PREG, ED    Imaging Review No results found.   EKG Interpretation None      MDM   Final diagnoses:  Nausea and vomiting, vomiting of unspecified type  Epigastric pain    8:47 AM 39 y.o. female who presents with vomiting which began 4 days ago. She is also had one episode of diarrhea. Sweats and chills but no documented fever at home. She is afebrile and hypertensive on initial vital signs. Also complaining of some mild epigastric burning. She states that [birth year] her birthday several days ago and has been drinking alcohol. We'll get screening labs and imaging.  12:19 PM: I interpreted/reviewed the labs and/or imaging which were non-contributory.  Pt cont to appear well. Sx likely related to viral syndrome. 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  I have discussed the diagnosis/risks/treatment options with the patient and believe the pt to be eligible for discharge home to follow-up with a pcp to be seen for her elevated BP's. We also discussed returning to the ED immediately if new or worsening sx occur. We discussed the sx which are most concerning (e.g., worsening abd pain, fever) that necessitate immediate return. Medications administered to the patient during their visit and any new prescriptions provided to the patient are listed below.  Medications given during this visit Medications  gi cocktail (Maalox,Lidocaine,Donnatal) (30 mLs Oral Given 12/02/14 0904)  sodium chloride 0.9 % bolus 1,000 mL (0 mLs  Intravenous Stopped 12/02/14 0959)  fentaNYL (SUBLIMAZE) injection 50 mcg (50 mcg Intravenous Given 12/02/14 0904)  HYDROmorphone (DILAUDID) injection 0.5 mg (0.5 mg Intravenous Given 12/02/14 0949)  HYDROmorphone (DILAUDID) injection 0.5 mg (0.5 mg Intravenous Given 12/02/14 1155)    New Prescriptions   ONDANSETRON (ZOFRAN ODT) 4 MG DISINTEGRATING TABLET     ODT q4 hours prn nausea/vomit   OXYCODONE-ACETAMINOPHEN (PERCOCET) 5-325 MG PER TABLET    Take 1 tablet by mouth every 4 (four) hours as needed.      Purvis Sheffield, MD 12/02/14 1220

## 2014-12-02 NOTE — Discharge Instructions (Signed)
Abdominal Pain, Women °Abdominal (stomach, pelvic, or belly) pain can be caused by many things. It is important to tell your doctor: °· The location of the pain. °· Does it come and go or is it present all the time? °· Are there things that start the pain (eating certain foods, exercise)? °· Are there other symptoms associated with the pain (fever, nausea, vomiting, diarrhea)? °All of this is helpful to know when trying to find the cause of the pain. °CAUSES  °· Stomach: virus or bacteria infection, or ulcer. °· Intestine: appendicitis (inflamed appendix), regional ileitis (Crohn's disease), ulcerative colitis (inflamed colon), irritable bowel syndrome, diverticulitis (inflamed diverticulum of the colon), or cancer of the stomach or intestine. °· Gallbladder disease or stones in the gallbladder. °· Kidney disease, kidney stones, or infection. °· Pancreas infection or cancer. °· Fibromyalgia (pain disorder). °· Diseases of the female organs: °¨ Uterus: fibroid (non-cancerous) tumors or infection. °¨ Fallopian tubes: infection or tubal pregnancy. °¨ Ovary: cysts or tumors. °¨ Pelvic adhesions (scar tissue). °¨ Endometriosis (uterus lining tissue growing in the pelvis and on the pelvic organs). °¨ Pelvic congestion syndrome (female organs filling up with blood just before the menstrual period). °¨ Pain with the menstrual period. °¨ Pain with ovulation (producing an egg). °¨ Pain with an IUD (intrauterine device, birth control) in the uterus. °¨ Cancer of the female organs. °· Functional pain (pain not caused by a disease, may improve without treatment). °· Psychological pain. °· Depression. °DIAGNOSIS  °Your doctor will decide the seriousness of your pain by doing an examination. °· Blood tests. °· X-rays. °· Ultrasound. °· CT scan (computed tomography, special type of X-ray). °· MRI (magnetic resonance imaging). °· Cultures, for infection. °· Barium enema (dye inserted in the large intestine, to better view it with  X-rays). °· Colonoscopy (looking in intestine with a lighted tube). °· Laparoscopy (minor surgery, looking in abdomen with a lighted tube). °· Major abdominal exploratory surgery (looking in abdomen with a large incision). °TREATMENT  °The treatment will depend on the cause of the pain.  °· Many cases can be observed and treated at home. °· Over-the-counter medicines recommended by your caregiver. °· Prescription medicine. °· Antibiotics, for infection. °· Birth control pills, for painful periods or for ovulation pain. °· Hormone treatment, for endometriosis. °· Nerve blocking injections. °· Physical therapy. °· Antidepressants. °· Counseling with a psychologist or psychiatrist. °· Minor or major surgery. °HOME CARE INSTRUCTIONS  °· Do not take laxatives, unless directed by your caregiver. °· Take over-the-counter pain medicine only if ordered by your caregiver. Do not take aspirin because it can cause an upset stomach or bleeding. °· Try a clear liquid diet (broth or water) as ordered by your caregiver. Slowly move to a bland diet, as tolerated, if the pain is related to the stomach or intestine. °· Have a thermometer and take your temperature several times a day, and record it. °· Bed rest and sleep, if it helps the pain. °· Avoid sexual intercourse, if it causes pain. °· Avoid stressful situations. °· Keep your follow-up appointments and tests, as your caregiver orders. °· If the pain does not go away with medicine or surgery, you may try: °¨ Acupuncture. °¨ Relaxation exercises (yoga, meditation). °¨ Group therapy. °¨ Counseling. °SEEK MEDICAL CARE IF:  °· You notice certain foods cause stomach pain. °· Your home care treatment is not helping your pain. °· You need stronger pain medicine. °· You want your IUD removed. °· You feel faint or   lightheaded. °· You develop nausea and vomiting. °· You develop a rash. °· You are having side effects or an allergy to your medicine. °SEEK IMMEDIATE MEDICAL CARE IF:  °· Your  pain does not go away or gets worse. °· You have a fever. °· Your pain is felt only in portions of the abdomen. The right side could possibly be appendicitis. The left lower portion of the abdomen could be colitis or diverticulitis. °· You are passing blood in your stools (bright red or black tarry stools, with or without vomiting). °· You have blood in your urine. °· You develop chills, with or without a fever. °· You pass out. °MAKE SURE YOU:  °· Understand these instructions. °· Will watch your condition. °· Will get help right away if you are not doing well or get worse. °Document Released: 06/25/2007 Document Revised: 01/12/2014 Document Reviewed: 07/15/2009 °[employer]® Patient Information ©2015 [employer]. This information is not intended to replace advice given to you by your health care provider. Make sure you discuss any questions you have with your health care provider. ° °

## 2014-12-02 NOTE — ED Notes (Signed)
Patient c/o mid abdominal pain and vomiting x 4 days. Diarrhea x 1.

## 2015-06-02 ENCOUNTER — Emergency Department (HOSPITAL_COMMUNITY)
Admission: EM | Admit: 2015-06-02 | Discharge: 2015-06-02 | Disposition: A | Discharge status: Home / Self Care | Payer: 59 | Attending: Emergency Medicine | Admitting: Emergency Medicine

## 2015-06-02 ENCOUNTER — Emergency Department (HOSPITAL_COMMUNITY): Payer: 59

## 2015-06-02 ENCOUNTER — Encounter (HOSPITAL_COMMUNITY): Payer: Self-pay | Admitting: Emergency Medicine

## 2015-06-02 DIAGNOSIS — E039 Hypothyroidism, unspecified: Secondary | ICD-10-CM | POA: Insufficient documentation

## 2015-06-02 DIAGNOSIS — Z720 Tobacco use: Secondary | ICD-10-CM | POA: Insufficient documentation

## 2015-06-02 DIAGNOSIS — R079 Chest pain, unspecified: Secondary | ICD-10-CM

## 2015-06-02 DIAGNOSIS — J069 Acute upper respiratory infection, unspecified: Secondary | ICD-10-CM | POA: Diagnosis not present

## 2015-06-02 DIAGNOSIS — Z79899 Other long term (current) drug therapy: Secondary | ICD-10-CM | POA: Diagnosis not present

## 2015-06-02 LAB — BASIC METABOLIC PANEL
ANION GAP: 7 (ref 5–15)
BUN: 11 mg/dL (ref 6–20)
CALCIUM: 9.1 mg/dL (ref 8.9–10.3)
CO2: 24 mmol/L (ref 22–32)
Chloride: 107 mmol/L (ref 101–111)
Creatinine, Ser: 0.99 mg/dL (ref 0.44–1.00)
GFR calc Af Amer: >60 mL/min (ref >60)
Glucose, Bld: 115 mg/dL — ABNORMAL HIGH (ref 65–99)
Potassium: 3.6 mmol/L (ref 3.5–5.1)
Sodium: 138 mmol/L (ref 135–145)

## 2015-06-02 LAB — CBC
HCT: 36.2 % (ref 36.0–46.0)
HEMOGLOBIN: 11.8 g/dL — AB (ref 12.0–15.0)
MCH: 32.3 pg (ref 26.0–34.0)
MCHC: 32.6 g/dL (ref 30.0–36.0)
MCV: 99.2 fL (ref 78.0–100.0)
Platelets: 194 10*3/uL (ref 150–400)
RBC: 3.65 MIL/uL — ABNORMAL LOW (ref 3.87–5.11)
RDW: 13.2 % (ref 11.5–15.5)
WBC: 5.5 10*3/uL (ref 4.0–10.5)

## 2015-06-02 LAB — I-STAT TROPONIN, ED: Troponin i, poc: 0 ng/mL (ref 0.00–0.08)

## 2015-06-02 MED ORDER — PREDNISONE 20 MG PO TABS: 40.0000 mg | ORAL_TABLET | Freq: Every day | ORAL | Status: DC

## 2015-06-02 MED ORDER — FENTANYL CITRATE (PF) 100 MCG/2ML IJ SOLN
50.0000 ug | Freq: Once | INTRAMUSCULAR | Status: AC
  Administered 2015-06-02: 50 ug via INTRAVENOUS
  Filled 2015-06-02: qty 2

## 2015-06-02 MED ORDER — GUAIFENESIN-DM 100-10 MG/5ML PO SYRP: 5.0000 mL | ORAL_SOLUTION | Freq: Three times a day (TID) | ORAL | Status: DC | PRN

## 2015-06-02 NOTE — ED Provider Notes (Signed)
CSN: [id number]     Arrival date & time 06/02/15  4098 History   First MD Initiated Contact with Patient 06/02/15 0715     Chief Complaint  Patient presents with  . Chest Pain     (Consider location/radiation/quality/duration/timing/severity/associated sxs/prior Treatment) Patient is a 39 y.o. female presenting with chest pain. The history is provided by the patient.  Chest Pain Associated symptoms: cough and shortness of breath   Associated symptoms: no abdominal pain, no back pain, no headache, no nausea, no numbness, not vomiting and no weakness    patient presents with chest pain. States his a sharp pain in her left chest. He goes to the back. Began couple days ago more severe today. Started with head congestion and cough. States it felt so she had a cold. Nose contacts. States she's had chills. She is a smoker. States she feels weak all over. States her eyes started swelling today and there was "cold" crust on them. Also some swelling in her upper lip. She states her nose is running. States it hurts to take a deep breath. Hurts with certain movements. No swelling or legs. She does not have known cardiac history.  Past Medical History  Diagnosis Date  . Hypothyroidism    History reviewed. No pertinent past surgical history. No family history on file. Social History  Substance Use Topics  . Smoking status: Current Some Day Smoker    Types: Cigarettes  . Smokeless tobacco: Never Used  . Alcohol Use: Yes     Comment: occassionally   OB History    Gravida Para Term Preterm AB TAB SAB Ectopic Multiple Living   Review of Systems  Constitutional: Positive for appetite change. Negative for activity change.  HENT: Positive for congestion and facial swelling.   Eyes: Positive for discharge. Negative for pain and redness.  Respiratory: Positive for cough and shortness of breath. Negative for chest tightness.   Cardiovascular: Positive for chest pain. Negative for  leg swelling.  Gastrointestinal: Negative for nausea, vomiting, abdominal pain and diarrhea.  Genitourinary: Negative for flank pain.  Musculoskeletal: Positive for myalgias. Negative for back pain and neck stiffness.  Skin: Negative for rash.  Neurological: Negative for weakness, numbness and headaches.  Psychiatric/Behavioral: Negative for behavioral problems.      Allergies  Review of patient's allergies indicates no known allergies.  Home Medications   Prior to Admission medications   Medication Sig Start Date End Date Taking? Authorizing Provider  levothyroxine (SYNTHROID, LEVOTHROID) 100 MCG tablet Take 100 mcg by mouth daily before breakfast.   Yes Historical Provider, MD  Pseudoephedrine-APAP-DM (DAYQUIL PO) Take 1 Dose by mouth once.   Yes Historical Provider, MD  guaiFENesin-dextromethorphan (ROBITUSSIN DM) 100-10 MG/5ML syrup Take 5 mLs by mouth 3 (three) times daily as needed for cough. 06/02/15   Benjiman Core, MD  predniSONE (DELTASONE) 20 MG tablet Take 2 tablets (40 mg total) by mouth daily. 06/02/15   Benjiman Core, MD   BP 122/79 mmHg  Pulse 57  Temp(Src) 98.7 F (37.1 C) (Oral)  Resp 16  Ht 5' (1.524 m)  Wt 130 lb (58.968 kg)  BMI 25.39 kg/m2  SpO2 100%  LMP 05/26/2015 Physical Exam  Constitutional: She is oriented to person, place, and time. She appears well-developed and well-nourished.  Patient appears uncomfortable  HENT:  Head: Normocephalic.  Some swelling of bilateral upper eyelids and some swelling of upper lip.  Eyes: EOM  are normal. Pupils are equal, round, and reactive to light.  Neck: Normal range of motion. Neck supple.  Cardiovascular: Normal rate, regular rhythm and normal heart sounds.   No murmur heard. Pulmonary/Chest: Effort normal. No respiratory distress. She has no wheezes.  Abdominal: Soft. Bowel sounds are normal. She exhibits no distension. There is no tenderness.  Musculoskeletal: Normal range of motion.  Neurological: She  is alert and oriented to person, place, and time.  Skin: Skin is warm and dry.  Psychiatric: Her speech is normal.  Nursing note and vitals reviewed.   ED Course  Procedures (including critical care time) Labs Review Labs Reviewed  BASIC METABOLIC PANEL - Abnormal; Notable for the following:    Glucose, Bld 115 (*)    All other components within normal limits  CBC - Abnormal; Notable for the following:    RBC 3.65 (*)    Hemoglobin 11.8 (*)    All other components within normal limits  I-STAT TROPOININ, ED    Imaging Review No results found. I have personally reviewed and evaluated these images and lab results as part of my medical decision-making.   EKG Interpretation   Date/Time:  Wednesday June 02 2015 07:01:59 EDT Ventricular Rate:  78 PR Interval:  180 QRS Duration: 89 QT Interval:  379 QTC Calculation: 432 R Axis:   86 Text Interpretation:  Sinus rhythm Borderline T abnormalities, inferior  leads no old to compare Confirmed by [facility]  MD, NATHAN [phone]) on  06/02/2015 7:26:58 AM      MDM   Final diagnoses:  Chest pain, unspecified chest pain type  URI (upper respiratory infection)    Patient with chest pain. EKG and lab work reassuring. Likely URI related. Discharge home.Benjiman Core, MD 06/04/15 [phone]

## 2015-06-02 NOTE — ED Notes (Signed)
Patient transported to X-ray 

## 2015-06-02 NOTE — ED Notes (Signed)
Patient reports new onset central CP starting last night, accompanied by SOB.  Patient states she thought she had a cold and went to sleep, but upon driving to work this morning, felt sharp stabbing pains in center of chest 10/10.  Patient has also had cough and eye swelling/irritation past few days.  Patient A&O x 4.

## 2015-06-02 NOTE — ED Notes (Signed)
MD at bedside. 

## 2015-06-02 NOTE — Discharge Instructions (Signed)
Chest Pain (Nonspecific) °It is often hard to give a specific diagnosis for the cause of chest pain. There is always a chance that your pain could be related to something serious, such as a heart attack or a blood clot in the lungs. You need to follow up with your health care provider for further evaluation. °CAUSES  °· Heartburn. °· Pneumonia or bronchitis. °· Anxiety or stress. °· Inflammation around your heart (pericarditis) or lung (pleuritis or pleurisy). °· A blood clot in the lung. °· A collapsed lung (pneumothorax). It can develop suddenly on its own (spontaneous pneumothorax) or from trauma to the chest. °· Shingles infection (herpes zoster virus). °The chest wall is composed of bones, muscles, and cartilage. Any of these can be the source of the pain. °· The bones can be bruised by injury. °· The muscles or cartilage can be strained by coughing or overwork. °· The cartilage can be affected by inflammation and become sore (costochondritis). °DIAGNOSIS  °Lab tests or other studies may be needed to find the cause of your pain. Your health care provider may have you take a test called an ambulatory electrocardiogram (ECG). An ECG records your heartbeat patterns over a 24-hour period. You may also have other tests, such as: °· Transthoracic echocardiogram (TTE). During echocardiography, sound waves are used to evaluate how blood flows through your heart. °· Transesophageal echocardiogram (TEE). °· Cardiac monitoring. This allows your health care provider to monitor your heart rate and rhythm in real time. °· Holter monitor. This is a portable device that records your heartbeat and can help diagnose heart arrhythmias. It allows your health care provider to track your heart activity for several days, if needed. °· Stress tests by exercise or by giving medicine that makes the heart beat faster. °TREATMENT  °· Treatment depends on what may be causing your chest pain. Treatment may include: °· Acid blockers for  heartburn. °· Anti-inflammatory medicine. °· Pain medicine for inflammatory conditions. °· Antibiotics if an infection is present. °· You may be advised to change lifestyle habits. This includes stopping smoking and avoiding alcohol, caffeine, and chocolate. °· You may be advised to keep your head raised (elevated) when sleeping. This reduces the chance of acid going backward from your stomach into your esophagus. °Most of the time, nonspecific chest pain will improve within 2-3 days with rest and mild pain medicine.  °HOME CARE INSTRUCTIONS  °· If antibiotics were prescribed, take them as directed. Finish them even if you start to feel better. °· For the next few days, avoid physical activities that bring on chest pain. Continue physical activities as directed. °· Do not use any tobacco products, including cigarettes, chewing tobacco, or electronic cigarettes. °· Avoid drinking alcohol. °· Only take medicine as directed by your health care provider. °· Follow your health care provider's suggestions for further testing if your chest pain does not go away. °· Keep any follow-up appointments you made. If you do not go to an appointment, you could develop lasting (chronic) problems with pain. If there is any problem keeping an appointment, call to reschedule. °SEEK MEDICAL CARE IF:  °· Your chest pain does not go away, even after treatment. °· You have a rash with blisters on your chest. °· You have a fever. °SEEK IMMEDIATE MEDICAL CARE IF:  °· You have increased chest pain or pain that spreads to your arm, neck, jaw, back, or abdomen. °· You have shortness of breath. °· You have an increasing cough, or you cough   up blood. °· You have severe back or abdominal pain. °· You feel nauseous or vomit. °· You have severe weakness. °· You faint. °· You have chills. °This is an emergency. Do not wait to see if the pain will go away. Get medical help at once. Call your local emergency services (911 in [place] Do not drive  yourself to the hospital. °MAKE SURE YOU:  °· Understand these instructions. °· Will watch your condition. °· Will get help right away if you are not doing well or get worse. °Document Released: 06/07/2005 Document Revised: 09/02/2013 Document Reviewed: 04/02/2008 °[employer]® Patient Information ©2015 [employer]. This information is not intended to replace advice given to you by your health care provider. Make sure you discuss any questions you have with your health care provider. ° °Upper Respiratory Infection, Adult °An upper respiratory infection (URI) is also sometimes known as the common cold. The upper respiratory tract includes the nose, sinuses, throat, trachea, and bronchi. Bronchi are the airways leading to the lungs. Most people improve within 1 week, but symptoms can last up to 2 weeks. A residual cough may last even longer.  °CAUSES °Many different viruses can infect the tissues lining the upper respiratory tract. The tissues become irritated and inflamed and often become very moist. Mucus production is also common. A cold is contagious. You can easily spread the virus to others by oral contact. This includes kissing, sharing a glass, coughing, or sneezing. Touching your mouth or nose and then touching a surface, which is then touched by another person, can also spread the virus. °SYMPTOMS  °Symptoms typically develop 1 to 3 days after you come in contact with a cold virus. Symptoms vary from person to person. They may include: °· Runny nose. °· Sneezing. °· Nasal congestion. °· Sinus irritation. °· Sore throat. °· Loss of voice (laryngitis). °· Cough. °· Fatigue. °· Muscle aches. °· Loss of appetite. °· Headache. °· Low-grade fever. °DIAGNOSIS  °You might diagnose your own cold based on familiar symptoms, since most people get a cold 2 to 3 times a year. Your caregiver can confirm this based on your exam. Most importantly, your caregiver can check that your symptoms are not due to another disease  such as strep throat, sinusitis, pneumonia, asthma, or epiglottitis. Blood tests, throat tests, and X-rays are not necessary to diagnose a common cold, but they may sometimes be helpful in excluding other more serious diseases. Your caregiver will decide if any further tests are required. °RISKS AND COMPLICATIONS  °You may be at risk for a more severe case of the common cold if you smoke cigarettes, have chronic heart disease (such as heart failure) or lung disease (such as asthma), or if you have a weakened immune system. The very young and very old are also at risk for more serious infections. Bacterial sinusitis, middle ear infections, and bacterial pneumonia can complicate the common cold. The common cold can worsen asthma and chronic obstructive pulmonary disease (COPD). Sometimes, these complications can require emergency medical care and may be life-threatening. °PREVENTION  °The best way to protect against getting a cold is to practice good hygiene. Avoid oral or hand contact with people with cold symptoms. Wash your hands often if contact occurs. There is no clear evidence that vitamin C, vitamin E, echinacea, or exercise reduces the chance of developing a cold. However, it is always recommended to get plenty of rest and practice good nutrition. °TREATMENT  °Treatment is directed at relieving symptoms. There is no   cure. Antibiotics are not effective, because the infection is caused by a virus, not by bacteria. Treatment may include: °· Increased fluid intake. Sports drinks offer valuable electrolytes, sugars, and fluids. °· Breathing heated mist or steam (vaporizer or shower). °· Eating chicken soup or other clear broths, and maintaining good nutrition. °· Getting plenty of rest. °· Using gargles or lozenges for comfort. °· Controlling fevers with ibuprofen or acetaminophen as directed by your caregiver. °· Increasing usage of your inhaler if you have asthma. °Zinc gel and zinc lozenges, taken in the first  24 hours of the common cold, can shorten the duration and lessen the severity of symptoms. Pain medicines may help with fever, muscle aches, and throat pain. A variety of non-prescription medicines are available to treat congestion and runny nose. Your caregiver can make recommendations and may suggest nasal or lung inhalers for other symptoms.  °HOME CARE INSTRUCTIONS  °· Only take over-the-counter or prescription medicines for pain, discomfort, or fever as directed by your caregiver. °· Use a warm mist humidifier or inhale steam from a shower to increase air moisture. This may keep secretions moist and make it easier to breathe. °· Drink enough water and fluids to keep your urine clear or pale yellow. °· Rest as needed. °· Return to work when your temperature has returned to normal or as your caregiver advises. You may need to stay home longer to avoid infecting others. You can also use a face mask and careful hand washing to prevent spread of the virus. °SEEK MEDICAL CARE IF:  °· After the first few days, you feel you are getting worse rather than better. °· You need your caregiver's advice about medicines to control symptoms. °· You develop chills, worsening shortness of breath, or brown or red sputum. These may be signs of pneumonia. °· You develop yellow or brown nasal discharge or pain in the face, especially when you bend forward. These may be signs of sinusitis. °· You develop a fever, swollen neck glands, pain with swallowing, or white areas in the back of your throat. These may be signs of strep throat. °SEEK IMMEDIATE MEDICAL CARE IF:  °· You have a fever. °· You develop severe or persistent headache, ear pain, sinus pain, or chest pain. °· You develop wheezing, a prolonged cough, cough up blood, or have a change in your usual mucus (if you have chronic lung disease). °· You develop sore muscles or a stiff neck. °Document Released: 02/21/2001 Document Revised: 11/20/2011 Document Reviewed:  12/03/2013 °[employer]® Patient Information ©2015 [employer]. This information is not intended to replace advice given to you by your health care provider. Make sure you discuss any questions you have with your health care provider. ° °

## 2015-06-06 ENCOUNTER — Emergency Department (HOSPITAL_COMMUNITY)
Admission: EM | Admit: 2015-06-06 | Discharge: 2015-06-06 | Disposition: A | Discharge status: Home / Self Care | Payer: 59 | Attending: Emergency Medicine | Admitting: Emergency Medicine

## 2015-06-06 ENCOUNTER — Encounter (HOSPITAL_COMMUNITY): Payer: Self-pay | Admitting: Emergency Medicine

## 2015-06-06 DIAGNOSIS — Z720 Tobacco use: Secondary | ICD-10-CM | POA: Insufficient documentation

## 2015-06-06 DIAGNOSIS — E039 Hypothyroidism, unspecified: Secondary | ICD-10-CM | POA: Insufficient documentation

## 2015-06-06 DIAGNOSIS — Z79899 Other long term (current) drug therapy: Secondary | ICD-10-CM | POA: Insufficient documentation

## 2015-06-06 DIAGNOSIS — R079 Chest pain, unspecified: Secondary | ICD-10-CM | POA: Diagnosis present

## 2015-06-06 DIAGNOSIS — J329 Chronic sinusitis, unspecified: Secondary | ICD-10-CM

## 2015-06-06 MED ORDER — DEXAMETHASONE SODIUM PHOSPHATE 10 MG/ML IJ SOLN
10.0000 mg | Freq: Once | INTRAMUSCULAR | Status: AC
  Administered 2015-06-06: 10 mg via INTRAVENOUS
  Filled 2015-06-06: qty 1

## 2015-06-06 MED ORDER — AMOXICILLIN-POT CLAVULANATE 875-125 MG PO TABS
1.0000 | ORAL_TABLET | Freq: Once | ORAL | Status: AC
  Administered 2015-06-06: 1 via ORAL
  Filled 2015-06-06: qty 1

## 2015-06-06 MED ORDER — IPRATROPIUM-ALBUTEROL 0.5-2.5 (3) MG/3ML IN SOLN
3.0000 mL | Freq: Once | RESPIRATORY_TRACT | Status: AC
  Administered 2015-06-06: 3 mL via RESPIRATORY_TRACT
  Filled 2015-06-06: qty 3

## 2015-06-06 MED ORDER — AMOXICILLIN-POT CLAVULANATE 875-125 MG PO TABS: 1.0000 | ORAL_TABLET | Freq: Two times a day (BID) | ORAL | Status: DC

## 2015-06-06 MED ORDER — FLUTICASONE PROPIONATE 50 MCG/ACT NA SUSP: 1.0000 | Freq: Every day | NASAL | Status: DC

## 2015-06-06 NOTE — ED Notes (Signed)
Pt. Stated, Im still having chest pain and really bad congestion in my face, my hroat is really sore.

## 2015-06-06 NOTE — Discharge Instructions (Signed)
Sinusitis °Sinusitis is redness, soreness, and inflammation of the paranasal sinuses. Paranasal sinuses are air pockets within the bones of your face (beneath the eyes, the middle of the forehead, or above the eyes). In healthy paranasal sinuses, mucus is able to drain out, and air is able to circulate through them by way of your nose. However, when your paranasal sinuses are inflamed, mucus and air can become trapped. This can allow bacteria and other germs to grow and cause infection. °Sinusitis can develop quickly and last only a short time (acute) or continue over a long period (chronic). Sinusitis that lasts for more than 12 weeks is considered chronic.  °CAUSES  °Causes of sinusitis include: °· Allergies. °· Structural abnormalities, such as displacement of the cartilage that separates your nostrils (deviated septum), which can decrease the air flow through your nose and sinuses and affect sinus drainage. °· Functional abnormalities, such as when the small hairs (cilia) that line your sinuses and help remove mucus do not work properly or are not present. °SIGNS AND SYMPTOMS  °Symptoms of acute and chronic sinusitis are the same. The primary symptoms are pain and pressure around the affected sinuses. Other symptoms include: °· Upper toothache. °· Earache. °· Headache. °· Bad breath. °· Decreased sense of smell and taste. °· A cough, which worsens when you are lying flat. °· Fatigue. °· Fever. °· Thick drainage from your nose, which often is green and may contain pus (purulent). °· Swelling and warmth over the affected sinuses. °DIAGNOSIS  °Your health care provider will perform a physical exam. During the exam, your health care provider may: °· Look in your nose for signs of abnormal growths in your nostrils (nasal polyps). °· Tap over the affected sinus to check for signs of infection. °· View the inside of your sinuses (endoscopy) using an imaging device that has a light attached (endoscope). °If your health  care provider suspects that you have chronic sinusitis, one or more of the following tests may be recommended: °· Allergy tests. °· Nasal culture. A sample of mucus is taken from your nose, sent to a lab, and screened for bacteria. °· Nasal cytology. A sample of mucus is taken from your nose and examined by your health care provider to determine if your sinusitis is related to an allergy. °TREATMENT  °Most cases of acute sinusitis are related to a viral infection and will resolve on their own within 10 days. Sometimes medicines are prescribed to help relieve symptoms (pain medicine, decongestants, nasal steroid sprays, or saline sprays).  °However, for sinusitis related to a bacterial infection, your health care provider will prescribe antibiotic medicines. These are medicines that will help kill the bacteria causing the infection.  °Rarely, sinusitis is caused by a fungal infection. In theses cases, your health care provider will prescribe antifungal medicine. °For some cases of chronic sinusitis, surgery is needed. Generally, these are cases in which sinusitis recurs more than 3 times per year, despite other treatments. °HOME CARE INSTRUCTIONS  °· Drink plenty of water. Water helps thin the mucus so your sinuses can drain more easily. °· Use a humidifier. °· Inhale steam 3 to 4 times a day (for example, sit in the bathroom with the shower running). °· Apply a warm, moist washcloth to your face 3 to 4 times a day, or as directed by your health care provider. °· Use saline nasal sprays to help moisten and clean your sinuses. °· Take medicines only as directed by your health care provider. °·   If you were prescribed either an antibiotic or antifungal medicine, finish it all even if you start to feel better. SEEK IMMEDIATE MEDICAL CARE IF:  You have increasing pain or severe headaches.  You have nausea, vomiting, or drowsiness.  You have swelling around your face.  You have vision problems.  You have a stiff  neck.  You have difficulty breathing. MAKE SURE YOU:   Understand these instructions.  Will watch your condition.  Will get help right away if you are not doing well or get worse. Document Released: 08/28/2005 Document Revised: 01/12/2014 Document Reviewed: 09/12/2011 [facility] Patient Information 2015 [place], [place]. This information is not intended to replace advice given to you by your health care provider. Make sure you discuss any questions you have with your health care provider.  Take anabiotic as prescribed may use intranasal steroid as needed for sinus congestion. Return to the emergency department if symptoms worsen or if you experience fever, difficulty breathing, chest pain, shortness breath.

## 2015-06-06 NOTE — ED Provider Notes (Signed)
CSN: [id number]     Arrival date & time 06/06/15  1133 History   First MD Initiated Contact with Patient 06/06/15 1151     Chief Complaint  Patient presents with  . Chest Pain  . URI     (Consider location/radiation/quality/duration/timing/severity/associated sxs/prior Treatment) HPI Comments: Kristin Wiley is a 39 y.o F with no significant pmhx that presents to the ED c/o persistent congestion, facial pain, and hoarseness. Pt was seen in ED on 9/21 with similar symptoms as well as chest pain that was thought to be pleuritic in nature. Pt was given prednisone which she states resolved the chest pain but pt is still experiencing congestion and facial pain. Pt also c/o runny nose, watery eyes, sore throat and hoarseness. Patient states congestion has been present for 1 month and has progressively gotten worse. Patient has been experiencing hoarseness for one week with associated sore throat. Denies fever, dysphagia, shortness of breath, ear pain, vomiting, diarrhea, syncope, weakness, lightheadedness.  Patient is a 39 y.o. female presenting with chest pain and URI. The history is provided by the patient.  Chest Pain URI   Past Medical History  Diagnosis Date  . Hypothyroidism    History reviewed. No pertinent past surgical history. No family history on file. Social History  Substance Use Topics  . Smoking status: Current Some Day Smoker    Types: Cigarettes  . Smokeless tobacco: Never Used  . Alcohol Use: Yes     Comment: occassionally   OB History    Gravida Para Term Preterm AB TAB SAB Ectopic Multiple Living   Review of Systems  Cardiovascular: Positive for chest pain.      Allergies  Review of patient's allergies indicates no known allergies.  Home Medications   Prior to Admission medications   Medication Sig Start Date End Date Taking? Authorizing Provider  guaiFENesin-dextromethorphan (ROBITUSSIN DM) 100-10 MG/5ML syrup Take 5 mLs by mouth 3  (three) times daily as needed for cough. 06/02/15  Yes Benjiman Core, MD  levothyroxine (SYNTHROID, LEVOTHROID) 100 MCG tablet Take 100 mcg by mouth daily before breakfast.   Yes Historical Provider, MD  predniSONE (DELTASONE) 20 MG tablet Take 2 tablets (40 mg total) by mouth daily. Patient not taking: Reported on 06/06/2015 06/02/15   Benjiman Core, MD   BP 144/78 mmHg  Pulse 52  Temp(Src) 98.2 F (36.8 C) (Oral)  Resp 10  Ht 5' (1.524 m)  Wt 137 lb (62.143 kg)  BMI 26.76 kg/m2  SpO2 100%  LMP 05/26/2015 Physical Exam  Constitutional: She is oriented to person, place, and time. She appears well-developed and well-nourished. No distress.  HENT:  Head: Normocephalic and atraumatic.  Right Ear: External ear normal.  Left Ear: External ear normal.  Mouth/Throat: Oropharynx is clear and moist. No oropharyngeal exudate.  TTP of maxillary sinuses.  TMs clear bilaterally. Oropharynx nonerythematous. No tonsillar exudate. Uvula midline. Nares boggy.  Eyes: Conjunctivae and EOM are normal. Pupils are equal, round, and reactive to light. Right eye exhibits no discharge. Left eye exhibits no discharge. No scleral icterus.  Neck: Normal range of motion. Neck supple.  Cardiovascular: Normal rate, regular rhythm, normal heart sounds and intact distal pulses.  Exam reveals no gallop and no friction rub.   No murmur heard. Pulmonary/Chest: Effort normal. No respiratory distress. She has wheezes ( Mild wheezes appreciated in upper lung fields.). She has no rales. She exhibits no tenderness.  Abdominal:  Soft. Bowel sounds are normal. She exhibits no distension and no mass. There is no tenderness. There is no rebound and no guarding.  Musculoskeletal: Normal range of motion. She exhibits no edema or tenderness.  Lymphadenopathy:    She has no cervical adenopathy.  Neurological: She is alert and oriented to person, place, and time.  Strength 5/5 throughout. No sensory deficits.  No gait  abnormality.  Skin: Skin is warm and dry. No rash noted. She is not diaphoretic. No erythema. No pallor.  Psychiatric: She has a normal mood and affect. Her behavior is normal.  Nursing note and vitals reviewed.   ED Course  Procedures (including critical care time) Labs Review Labs Reviewed - No data to display  Imaging Review No results found. I have personally reviewed and evaluated these images and lab results as part of my medical decision-making.   EKG Interpretation None      MDM   Final diagnoses:  Chronic sinusitis, unspecified location    Patient with persistent congestion for longer than 1 month. Suspect chronic sinusitis. Will treat with Augmentin. Recommend nasal saline. We'll also give intranasal steroids. Suspect hoarseness is due to laryngitis. No additional treatment required. Recommend drinking warm T to soothe throat. Patient is currently chest pain-free however still wheezing on exam. Do not think repeat chest x-ray is necessary at this time as symptoms have not changed or worsened since last being seen. Patient given DuoNeb and emergency department as well as 10 mg Decadron injection. Recommend follow-up with PCP. Discussed treatment plan with patient who is agreeable. Return precautions outlined in patient discharge instructions.    Lester Kinsman [place], PA-C 06/06/15 1648  Marily Memos, MD 06/07/15 [phone]

## 2015-07-19 ENCOUNTER — Emergency Department (HOSPITAL_COMMUNITY): Payer: 59

## 2015-07-19 ENCOUNTER — Other Ambulatory Visit: Payer: Self-pay

## 2015-07-19 ENCOUNTER — Emergency Department (HOSPITAL_COMMUNITY)
Admission: EM | Admit: 2015-07-19 | Discharge: 2015-07-19 | Disposition: A | Discharge status: Home / Self Care | Payer: 59 | Attending: Emergency Medicine | Admitting: Emergency Medicine

## 2015-07-19 ENCOUNTER — Encounter (HOSPITAL_COMMUNITY): Payer: Self-pay | Admitting: *Deleted

## 2015-07-19 DIAGNOSIS — Z3202 Encounter for pregnancy test, result negative: Secondary | ICD-10-CM | POA: Insufficient documentation

## 2015-07-19 DIAGNOSIS — Z720 Tobacco use: Secondary | ICD-10-CM | POA: Insufficient documentation

## 2015-07-19 DIAGNOSIS — Z79899 Other long term (current) drug therapy: Secondary | ICD-10-CM | POA: Insufficient documentation

## 2015-07-19 DIAGNOSIS — Z7951 Long term (current) use of inhaled steroids: Secondary | ICD-10-CM | POA: Insufficient documentation

## 2015-07-19 DIAGNOSIS — K529 Noninfective gastroenteritis and colitis, unspecified: Secondary | ICD-10-CM

## 2015-07-19 DIAGNOSIS — H578 Other specified disorders of eye and adnexa: Secondary | ICD-10-CM | POA: Insufficient documentation

## 2015-07-19 DIAGNOSIS — R51 Headache: Secondary | ICD-10-CM | POA: Insufficient documentation

## 2015-07-19 DIAGNOSIS — R059 Cough, unspecified: Secondary | ICD-10-CM

## 2015-07-19 DIAGNOSIS — R05 Cough: Secondary | ICD-10-CM | POA: Insufficient documentation

## 2015-07-19 DIAGNOSIS — J3489 Other specified disorders of nose and nasal sinuses: Secondary | ICD-10-CM | POA: Insufficient documentation

## 2015-07-19 DIAGNOSIS — E039 Hypothyroidism, unspecified: Secondary | ICD-10-CM | POA: Insufficient documentation

## 2015-07-19 DIAGNOSIS — R0981 Nasal congestion: Secondary | ICD-10-CM | POA: Insufficient documentation

## 2015-07-19 LAB — I-STAT BETA HCG BLOOD, ED (MC, WL, AP ONLY): I-stat hCG, quantitative: <5 m[IU]/mL (ref <5)

## 2015-07-19 LAB — CBC
HEMATOCRIT: 42.7 % (ref 36.0–46.0)
HEMOGLOBIN: 14.2 g/dL (ref 12.0–15.0)
MCH: 32.5 pg (ref 26.0–34.0)
MCHC: 33.3 g/dL (ref 30.0–36.0)
MCV: 97.7 fL (ref 78.0–100.0)
Platelets: 305 10*3/uL (ref 150–400)
RBC: 4.37 MIL/uL (ref 3.87–5.11)
RDW: 12.9 % (ref 11.5–15.5)
WBC: 9.3 10*3/uL (ref 4.0–10.5)

## 2015-07-19 LAB — COMPREHENSIVE METABOLIC PANEL
ALBUMIN: 4.9 g/dL (ref 3.5–5.0)
ALK PHOS: 55 U/L (ref 38–126)
ALT: 26 U/L (ref 14–54)
ANION GAP: 16 — AB (ref 5–15)
AST: 34 U/L (ref 15–41)
BILIRUBIN TOTAL: 1.3 mg/dL — AB (ref 0.3–1.2)
BUN: 9 mg/dL (ref 6–20)
CO2: 22 mmol/L (ref 22–32)
Calcium: 10 mg/dL (ref 8.9–10.3)
Chloride: 99 mmol/L — ABNORMAL LOW (ref 101–111)
Creatinine, Ser: 1.01 mg/dL — ABNORMAL HIGH (ref 0.44–1.00)
GFR calc Af Amer: >60 mL/min (ref >60)
GFR calc non Af Amer: >60 mL/min (ref >60)
GLUCOSE: 197 mg/dL — AB (ref 65–99)
POTASSIUM: 3.6 mmol/L (ref 3.5–5.1)
SODIUM: 137 mmol/L (ref 135–145)
Total Protein: 8.7 g/dL — ABNORMAL HIGH (ref 6.5–8.1)

## 2015-07-19 LAB — I-STAT TROPONIN, ED: Troponin i, poc: 0 ng/mL (ref 0.00–0.08)

## 2015-07-19 LAB — LIPASE, BLOOD: Lipase: 41 U/L (ref 11–51)

## 2015-07-19 LAB — CBG MONITORING, ED: GLUCOSE-CAPILLARY: 175 mg/dL — AB (ref 65–99)

## 2015-07-19 MED ORDER — ONDANSETRON 4 MG PO TBDP
4.0000 mg | ORAL_TABLET | Freq: Once | ORAL | Status: AC | PRN
  Administered 2015-07-19: 4 mg via ORAL

## 2015-07-19 MED ORDER — SODIUM CHLORIDE 0.9 % IV BOLUS (SEPSIS)
1000.0000 mL | Freq: Once | INTRAVENOUS | Status: AC
  Administered 2015-07-19: 1000 mL via INTRAVENOUS

## 2015-07-19 MED ORDER — ONDANSETRON 4 MG PO TBDP
ORAL_TABLET | ORAL | Status: AC
  Filled 2015-07-19: qty 1

## 2015-07-19 MED ORDER — PANTOPRAZOLE SODIUM 40 MG IV SOLR
40.0000 mg | Freq: Once | INTRAVENOUS | Status: AC
  Administered 2015-07-19: 40 mg via INTRAVENOUS
  Filled 2015-07-19: qty 40

## 2015-07-19 MED ORDER — KETOROLAC TROMETHAMINE 60 MG/2ML IM SOLN
60.0000 mg | Freq: Once | INTRAMUSCULAR | Status: AC
  Administered 2015-07-19: 60 mg via INTRAMUSCULAR
  Filled 2015-07-19: qty 2

## 2015-07-19 MED ORDER — ONDANSETRON HCL 4 MG PO TABS: 4.0000 mg | ORAL_TABLET | Freq: Four times a day (QID) | ORAL | Status: DC | PRN

## 2015-07-19 NOTE — ED Notes (Signed)
Checked patient blood sugar it was 175 notified the rn of blood sugar

## 2015-07-19 NOTE — ED Provider Notes (Signed)
CSN: [id number]     Arrival date & time 07/19/15  1032 History   First MD Initiated Contact with Patient 07/19/15 1310     Chief Complaint  Patient presents with  . Abdominal Pain  . Emesis  . Diarrhea     (Consider location/radiation/quality/duration/timing/severity/associated sxs/prior Treatment) Patient is a 39 y.o. female presenting with abdominal pain, vomiting, and diarrhea. The history is provided by the patient and medical records. No language interpreter was used.  Abdominal Pain Associated symptoms: chills, cough, diarrhea, fever, nausea and vomiting   Associated symptoms: no constipation, no fatigue, no shortness of breath and no sore throat   Emesis Associated symptoms: abdominal pain, chills, diarrhea and headaches   Associated symptoms: no arthralgias, no myalgias and no sore throat   Diarrhea Associated symptoms: abdominal pain, chills, fever, headaches and vomiting   Associated symptoms: no arthralgias and no myalgias    Kristin Wiley is a 39 y.o. female  with a PMH of hypothyroidism presents to the Emergency Department complaining of nausea, vomiting, diarrhea x 1 day. No blood noticed in BM or emesis. Patient states she had congestion/cold symptoms for the past week as well. +subjective fever, chills   Past Medical History  Diagnosis Date  . Hypothyroidism    History reviewed. No pertinent past surgical history. No family history on file. Social History  Substance Use Topics  . Smoking status: Current Some Day Smoker    Types: Cigarettes  . Smokeless tobacco: Never Used  . Alcohol Use: Yes     Comment: occassionally   OB History    Gravida Para Term Preterm AB TAB SAB Ectopic Multiple Living   Review of Systems  Constitutional: Positive for fever and chills. Negative for fatigue and unexpected weight change.  HENT: Positive for congestion and rhinorrhea. Negative for sore throat.   Eyes: Positive for discharge.  Respiratory:  Positive for cough. Negative for shortness of breath and wheezing.   Cardiovascular: Negative for palpitations and leg swelling.  Gastrointestinal: Positive for nausea, vomiting, abdominal pain and diarrhea. Negative for constipation and blood in stool.  Endocrine: Negative for polydipsia and polyuria.  Musculoskeletal: Negative for myalgias, back pain, arthralgias and neck pain.  Skin: Negative for rash.  Neurological: Positive for headaches. Negative for dizziness and weakness.      Allergies  Review of patient's allergies indicates no known allergies.  Home Medications   Prior to Admission medications   Medication Sig Start Date End Date Taking? Authorizing Provider  levothyroxine (SYNTHROID, LEVOTHROID) 100 MCG tablet Take 100 mcg by mouth daily before breakfast.   Yes Historical Provider, MD  fluticasone (FLONASE) 50 MCG/ACT nasal spray Place 1 spray into both nostrils daily. Patient not taking: Reported on 07/19/2015 06/06/15   Samantha Tripp Dowless, PA-C  guaiFENesin-dextromethorphan (ROBITUSSIN DM) 100-10 MG/5ML syrup Take 5 mLs by mouth 3 (three) times daily as needed for cough. Patient not taking: Reported on 07/19/2015 06/02/15   Benjiman Core, MD  ondansetron (ZOFRAN) 4 MG tablet Take 1 tablet (4 mg total) by mouth every 6 (six) hours as needed for nausea or vomiting. 07/19/15   Ren Aspinall Pilcher Laelyn Blumenthal, PA-C   BP 109/73 mmHg  Pulse 50  Temp(Src) 98.6 F (37 C) (Oral)  Resp 18  SpO2 98%  LMP 07/18/2015 Physical Exam  Constitutional: She is oriented to person, place, and time. She appears well-developed and well-nourished.  Alert and in no acute distress  HENT:  Head: Normocephalic and atraumatic.  Nose: Mucosal edema and rhinorrhea present.  Mouth/Throat: Oropharynx is clear and moist. Mucous membranes are dry. No oropharyngeal exudate.  Cardiovascular: Normal rate, regular rhythm, normal heart sounds and intact distal pulses.  Exam reveals no gallop and no friction rub.    No murmur heard. Pulmonary/Chest: Effort normal. No respiratory distress. She has no wheezes. She has no rales. She exhibits no tenderness.  Slightly diminished breath sounds bilaterally  Abdominal: She exhibits no mass. There is no rebound and no guarding.  Abdomen soft, non-distended Generalized abdominal tenderness Bowel sounds positive in all four quadrants   Musculoskeletal: She exhibits no edema.  Neurological: She is alert and oriented to person, place, and time.  Skin: Skin is warm and dry. No rash noted.  Psychiatric: She has a normal mood and affect. Her behavior is normal. Judgment and thought content normal.  Nursing note and vitals reviewed.   ED Course  Procedures (including critical care time) Labs Review Labs Reviewed  COMPREHENSIVE METABOLIC PANEL - Abnormal; Notable for the following:    Chloride 99 (*)    Glucose, Bld 197 (*)    Creatinine, Ser 1.01 (*)    Total Protein 8.7 (*)    Total Bilirubin 1.3 (*)    Anion gap 16 (*)    All other components within normal limits  CBG MONITORING, ED - Abnormal; Notable for the following:    Glucose-Capillary 175 (*)    All other components within normal limits  LIPASE, BLOOD  CBC  URINALYSIS, ROUTINE W REFLEX MICROSCOPIC (NOT AT [facility])  I-STAT BETA HCG BLOOD, ED ([facility], [facility], [facility] ONLY)  I-STAT TROPOININ, ED    Imaging Review Dg Chest 2 View  07/19/2015  CLINICAL DATA:  Cough and fever with shortness of breath for 1 day EXAM: CHEST  2 VIEW COMPARISON:  June 02, 2015 FINDINGS: The lungs are clear. The heart size and pulmonary vascularity are normal. No adenopathy. No bone lesions. IMPRESSION: No edema or consolidation. Electronically Signed   By: Bretta BangWilliam  Woodruff III M.D.   On: 07/19/2015 14:41   I have personally reviewed and evaluated these images and lab results as part of my medical decision-making.   EKG Interpretation None      MDM   Final diagnoses:  Cough  Gastroenteritis   Kristin Wiley presents  with cold sxs x 1 week, n/v/d x 1 day likely gastroenteritis. Due to lingering symptoms and slightly decreased breath sounds on exam, will order CXR to r/o PNA.  Labs reassuring; Cr 1.01 IVfluids for hydration, toradol for HA  CXR shows no cardiopulm dz; no PNA  Symptomatic care with discharge to home in stable condition.   BRAT diet and hydration stressed, return precautions given, all questions answered.   [facility] Hospital[facility] Pilcher Burnice Oestreicher, PA-C 07/19/15 1459  Benjiman CoreNathan Pickering, MD 07/22/15 2251

## 2015-07-19 NOTE — ED Notes (Signed)
Pt is in stable condition upon d/c and ambulates from ED. 

## 2015-07-19 NOTE — ED Notes (Addendum)
Pt is here with upper abdominal pain that start yesterday morning and she developed diarrhea and vomiting. Pt reports intermittent headache all over and right now 10/10.  Denies HTN.  Pt reports sharp pains to chest.  Pt states she is going to pass out

## 2015-07-19 NOTE — Discharge Instructions (Signed)
It was our pleasure to take care of you today!   Use zofran as needed for nausea. Stay well hydrated with small sips of fluids throughout the day.   The 'BRAT' diet is suggested for the next 24-28 hours, then progress to diet as tolerated as symptoms abate. Return to ER if bloody stools, persistent diarrhea, vomiting, fever or any new/worsening symptoms.  Follow up with your primary doctor in 3 days to discuss your diagnosis and ensure healing. If you don't have a primary doctor, see the resource guide below.    Emergency Department Resource Guide 1) Find a Doctor and Pay Out of Pocket Although you won't have to find out who is covered by your insurance plan, it is a good idea to ask around and get recommendations. You will then need to call the office and see if the doctor you have chosen will accept you as a new patient and what types of options they offer for patients who are self-pay. Some doctors offer discounts or will set up payment plans for their patients who do not have insurance, but you will need to ask so you aren't surprised when you get to your appointment.  2) Contact Your Local Health Department Not all health departments have doctors that can see patients for sick visits, but many do, so it is worth a call to see if yours does. If you don't know where your local health department is, you can check in your phone book. The CDC also has a tool to help you locate your state's health department, and many state websites also have listings of all of their local health departments.  3) Find a Walk-in Clinic If your illness is not likely to be very severe or complicated, you may want to try a walk in clinic. These are popping up all over the country in pharmacies, drugstores, and shopping centers. They're usually staffed by nurse practitioners or physician assistants that have been trained to treat common illnesses and complaints. They're usually fairly quick and inexpensive. However, if  you have serious medical issues or chronic medical problems, these are probably not your best option.  No Primary Care Doctor: - Call Health Connect at  [phone] - they can help you locate a primary care doctor that  accepts your insurance, provides certain services, etc. - Physician Referral Service- [phone]  Chronic Pain Problems: Organization         Address  Phone   Notes  [facility]  [phone] Patients need to be referred by their primary care doctor.   Medication Assistance: Organization         Address  Phone   Notes  [facility] [address] [place], [place] [address] [phone] --Must be a resident of [facility] [place] -- Must have NO insurance coverage whatsoever (no Medicaid/ Medicare, etc.) -- The pt. MUST have a primary care doctor that directs their care regularly and follows them in the community   MedAssist  [phone]   [employer]  [phone]    Agencies that provide inexpensive medical care: Organization         Address  Phone   Notes  [facility]  [phone]   [facility]    [phone]   [facility] [address] [place], [place] [address] [phone]   [facility] [address], [place] [phone]  Planned Parenthood    [phone]   [facility]    [phone]   Community [facility] and [address], [place] Phone:  [phone], Fax:  [phone] Hours of Operation:  9 am - 6 pm, M-F.  Also accepts Medicaid/Medicare and self-pay.  [facility] for [address] [place], [address], [place] Phone: [phone], Fax: [phone]. Hours of Operation:  8:30 am - 5:30 pm, M-F.  Also accepts Medicaid and self-pay.  [facility] [address] [place] Phone: [phone]   [place], [place], [place] [phone], Ext. [phone] Mondays & Thursdays: 7-9 AM.  First 15 patients are seen on a first come, first serve basis.    [place] Providers:  Organization         Address  Phone   Notes  [facility] [address], [place] [phone] Also accepts self-pay patients.  [facility] [address], [place]  [phone]   [place], [address], [place] [phone]   [facility] [address], [place] [phone]   Lucianne Lei [address], [place]   [phone] Only accepts [place] Access [place] patients after they have their name applied to their card.   Self-Pay (no insurance) in [facility]:  Organization         Address  Phone   Notes  Sickle Cell Patients, [facility] [place] [phone]   [facility] [place] [phone]   [facility] [place]  [place], [place], [place] [phone]   [facility]/Dr. Osei-Bonsu  [address], [place] or [address], [place] [phone] Phone number for both [place] and [place] locations is the same.  [facility] [address], [place] [phone]   [facility] [address] [place] or [address] [phone]   [facility] [address] [place] [phone], phone; [phone], fax Sees patients 1st and 3rd Saturday of every month.  Must not qualify for public or private insurance (i.e. Medicaid, Medicare, [place] Health Choice, Veterans' Benefits)  Household income should be no more than 200% of the poverty level The clinic cannot treat you if you are pregnant or think you are  pregnant  Sexually transmitted diseases are not treated at the clinic.    Dental Care: Organization         Address  Phone  Notes  [facility] of [facility] [place] [phone] Accepts children up to age 33 who are enrolled in [place] or [place]; pregnant women with a Medicaid card; and children who have applied for Medicaid or [place] Health Choice, but were declined, whose parents can pay a reduced fee at time of service.  [facility]  [address], [place] [phone] Accepts children up to age 110 who are enrolled in [place] or [place]; pregnant women with a Medicaid card; and children who have applied for Medicaid or [place], but were declined,  whose parents can pay a reduced fee at time of service.  [facility]  [place] [phone] Patients are seen by appointment only. Walk-ins are not accepted. [place] will see patients 59 years of age and older. Monday - Tuesday (8am-5pm) Most Wednesdays (8:30-5pm) $30 per visit, cash only  [facility] Access PROGRAM  [address], [facility] [phone] Patients are seen by appointment only. Walk-ins are not accepted. [place] will see patients 72 years of age and older. One Wednesday Evening (Monthly: Volunteer Based).  $30 per visit, cash only  [place]  [phone] for adults; Children under age 53, call Graduate Pediatric Dentistry at [phone]. Children aged 3-14, please call [phone] to request a pediatric application.  Dental services are provided in all areas of dental care including fillings, crowns and bridges, complete and partial dentures, implants, gum treatment, root canals, and extractions. Preventive care is also provided. Treatment is provided to both adults and  children. Patients are selected via a lottery and there is often a waiting list.   [facility] [address], [place]  [phone] [email or web address]   [facility] [address] [place], [place] [phone], Ext. [phone] Second and Fourth Thursday of each month, opens at 6:30 AM; Clinic ends at 9 AM.  Patients are seen on a first-come first-served basis, and a limited number are seen during each clinic.   [facility]  [address] Hillard Danker [place], [place] [phone]   Eligibility Requirements You must have lived in [place], [place], or [place] counties for at least the last three months.   You cannot be eligible for state or federal sponsored [employer], including [employer], [place], or [employer].   You generally cannot be eligible for healthcare insurance through your employer.    How to apply: Eligibility screenings are held every Tuesday and Wednesday afternoon from 1:00 pm until 4:00 pm. You do not need an appointment for the interview!  [facility] [address], [place], [place]   [place]  [place] Department  [place]  [phone]    Behavioral Health Resources in the Community: Intensive Outpatient Programs Organization         Address  Phone  Notes  [place]. [address], [place], [place] [phone]   [facility]patient [address] [place], [place]   ADS: Alcohol [facility] Drug S[facility]cs [address] [place], [place]   [place] [address]  [place], [place] or [phone]   Substance Abuse Resources Organization         Address  Phone  Notes  Alcohol and Drug Services  [phone]   [place]  [phone]   [facility]    Chinita Pester  [phone]   Residential & Outpatient Substance Abuse Program  [phone]   Psychological Services Organization         Address  Phone  Notes  [facility] [place]  [place]  [phone]   [place] [address] [place] or [phone]    Mobile Crisis Teams Organization         Address  Phone  Notes  [facility], Mobile  Crisis Care Unit  [phone]   [facility]  [address] [place], [place]   [facility] [address] [place] [phone]    Self-Help/Support Groups Organization         Address  Phone             Notes  [place]. of [place] - variety of support groups  [place] Call for more information  Narcotics Anonymous (NA), [facility] [address], [employer] [place]  2 meetings at this location   Special educational needs teacher         Address  Phone  Notes  [facility]idential [facility] [place],    [place]  [phone]   [facility]  [address], [place] [address], [place], [place]   [place], [place] [phone] Admissions: 8am-3pm M-F  Incentives Substance [place] [address],    [place], [place] [phone]   [facility] [address] [place], [place], [place]   [facility] [address],  [place], [place]   [facility] - Intensive Outpatient [address]een [address], [place], [place]   [facility] ([place] [address].,  [place], [place] [phone] or [phone]   [facility] (RTS) [address] [place], [place] Accepts Medicaid  [facility] [place] [address]  [place] [phone] Substance Abuse/Addiction Treatment   [facility] Organization         Address  Phone  Notes  [facility]  [phone]   Domenic Schwab, PhD [address] Arlis [place], [place]   [phone] or [phone]   [facility] [place], [place] [phone]   [facility] [address] [place], [place] [phone] Insurance/Medicaid/sponsorship through [facility] and Families [address]                                    [employer], [place] [phone] [place] [address], [place] [phone]    Dr. Adele Schilder  [phone]   [facility] Dept. 1) [address] [place] 2) [place] 3)  [place] [address], [place] [phone]  [phone]   [place] [phone] or [phone] (After Hours)

## 2016-09-30 ENCOUNTER — Emergency Department (HOSPITAL_COMMUNITY)
Admission: EM | Admit: 2016-09-30 | Discharge: 2016-09-30 | Disposition: A | Discharge status: Home / Self Care | Payer: 59 | Attending: Emergency Medicine | Admitting: Emergency Medicine

## 2016-09-30 ENCOUNTER — Encounter (HOSPITAL_COMMUNITY): Payer: Self-pay | Admitting: Emergency Medicine

## 2016-09-30 ENCOUNTER — Emergency Department (HOSPITAL_COMMUNITY): Payer: 59

## 2016-09-30 DIAGNOSIS — R112 Nausea with vomiting, unspecified: Secondary | ICD-10-CM | POA: Insufficient documentation

## 2016-09-30 DIAGNOSIS — R109 Unspecified abdominal pain: Secondary | ICD-10-CM | POA: Diagnosis present

## 2016-09-30 DIAGNOSIS — E039 Hypothyroidism, unspecified: Secondary | ICD-10-CM | POA: Insufficient documentation

## 2016-09-30 DIAGNOSIS — R197 Diarrhea, unspecified: Secondary | ICD-10-CM

## 2016-09-30 DIAGNOSIS — F17210 Nicotine dependence, cigarettes, uncomplicated: Secondary | ICD-10-CM | POA: Diagnosis not present

## 2016-09-30 DIAGNOSIS — Z79899 Other long term (current) drug therapy: Secondary | ICD-10-CM | POA: Insufficient documentation

## 2016-09-30 LAB — CBC
HEMATOCRIT: 41.1 % (ref 36.0–46.0)
HEMOGLOBIN: 13.8 g/dL (ref 12.0–15.0)
MCH: 32 pg (ref 26.0–34.0)
MCHC: 33.6 g/dL (ref 30.0–36.0)
MCV: 95.4 fL (ref 78.0–100.0)
Platelets: 248 10*3/uL (ref 150–400)
RBC: 4.31 MIL/uL (ref 3.87–5.11)
RDW: 13.3 % (ref 11.5–15.5)
WBC: 11.5 10*3/uL — ABNORMAL HIGH (ref 4.0–10.5)

## 2016-09-30 LAB — COMPREHENSIVE METABOLIC PANEL
ALK PHOS: 69 U/L (ref 38–126)
ALT: 21 U/L (ref 14–54)
ANION GAP: 15 (ref 5–15)
AST: 27 U/L (ref 15–41)
Albumin: 5 g/dL (ref 3.5–5.0)
BUN: 7 mg/dL (ref 6–20)
CALCIUM: 10 mg/dL (ref 8.9–10.3)
CO2: 23 mmol/L (ref 22–32)
Chloride: 97 mmol/L — ABNORMAL LOW (ref 101–111)
Creatinine, Ser: 0.82 mg/dL (ref 0.44–1.00)
GFR calc Af Amer: >60 mL/min (ref >60)
GFR calc non Af Amer: >60 mL/min (ref >60)
GLUCOSE: 164 mg/dL — AB (ref 65–99)
POTASSIUM: 3 mmol/L — AB (ref 3.5–5.1)
Sodium: 135 mmol/L (ref 135–145)
Total Bilirubin: 1.3 mg/dL — ABNORMAL HIGH (ref 0.3–1.2)
Total Protein: 8.7 g/dL — ABNORMAL HIGH (ref 6.5–8.1)

## 2016-09-30 LAB — URINALYSIS, ROUTINE W REFLEX MICROSCOPIC
Bilirubin Urine: NEGATIVE
Glucose, UA: 50 mg/dL — AB
Ketones, ur: 5 mg/dL — AB
Leukocytes, UA: NEGATIVE
Nitrite: NEGATIVE
Specific Gravity, Urine: 1.028 (ref 1.005–1.030)
pH: 5 (ref 5.0–8.0)

## 2016-09-30 LAB — I-STAT BETA HCG BLOOD, ED (MC, WL, AP ONLY)

## 2016-09-30 LAB — LIPASE, BLOOD: Lipase: 22 U/L (ref 11–51)

## 2016-09-30 MED ORDER — MORPHINE SULFATE (PF) 4 MG/ML IV SOLN
4.0000 mg | Freq: Once | INTRAVENOUS | Status: AC
  Administered 2016-09-30: 4 mg via INTRAVENOUS
  Filled 2016-09-30: qty 1

## 2016-09-30 MED ORDER — SODIUM CHLORIDE 0.9 % IV BOLUS (SEPSIS)
1000.0000 mL | Freq: Once | INTRAVENOUS | Status: AC
  Administered 2016-09-30: 1000 mL via INTRAVENOUS

## 2016-09-30 MED ORDER — POTASSIUM CHLORIDE CRYS ER 20 MEQ PO TBCR
40.0000 meq | EXTENDED_RELEASE_TABLET | Freq: Once | ORAL | Status: AC
  Administered 2016-09-30: 40 meq via ORAL
  Filled 2016-09-30: qty 2

## 2016-09-30 MED ORDER — PROMETHAZINE HCL 25 MG PO TABS: 25.0000 mg | ORAL_TABLET | Freq: Three times a day (TID) | ORAL | 0 refills | Status: DC | PRN

## 2016-09-30 MED ORDER — ONDANSETRON 4 MG PO TBDP
4.0000 mg | ORAL_TABLET | Freq: Once | ORAL | Status: AC
  Administered 2016-09-30: 4 mg via ORAL

## 2016-09-30 MED ORDER — SODIUM CHLORIDE 0.9 % IV SOLN
30.0000 meq | Freq: Once | INTRAVENOUS | Status: AC
  Administered 2016-09-30: 30 meq via INTRAVENOUS
  Filled 2016-09-30: qty 15

## 2016-09-30 MED ORDER — ONDANSETRON 4 MG PO TBDP
ORAL_TABLET | ORAL | Status: AC
  Filled 2016-09-30: qty 1

## 2016-09-30 NOTE — ED Notes (Signed)
Pt returned from CT °

## 2016-09-30 NOTE — ED Notes (Signed)
Pt to CT

## 2016-09-30 NOTE — ED Notes (Signed)
Family at bedside. 

## 2016-09-30 NOTE — ED Notes (Signed)
Pressure verified manually, PA notified.

## 2016-09-30 NOTE — ED Provider Notes (Signed)
[facility] Provider Note   CSN: [id number] Arrival date & time: 09/30/16  0256     History   Chief Complaint Chief Complaint  Patient presents with  . Abdominal Pain    Emesis/Diarrhea    HPI Kristin Wiley is a 41 y.o. female.  HPI Patient presents to the emergency department with a 2 day history of nausea, vomiting and diarrhea.  The patient states she has also had some abdominal discomfort and states that she has more soreness in her abdomen.  She states that the vomiting and diarrhea started before the discomfort.  Patient states she did not take any medications prior to arrival.  States nothing seems make the condition better or worse.  She states she has been unable to keep any fluids down by mouthThe patient denies chest pain, shortness of breath, headache,blurred vision, neck pain, fever, cough, weakness, numbness, dizziness, anorexia, edema, rash, back pain, dysuria, hematemesis, bloody stool, near syncope, or syncope. Past Medical History:  Diagnosis Date  . Hypothyroidism     There are no active problems to display for this patient.   History reviewed. No pertinent surgical history.  OB History    Gravida Para Term Preterm AB Living   2 1 1   1 1    SAB TAB Ectopic Multiple Live Births     1     1       Home Medications    Prior to Admission medications   Medication Sig Start Date End Date Taking? Authorizing Provider  fluticasone (FLONASE) 50 MCG/ACT nasal spray Place 1 spray into both nostrils daily. Patient not taking: Reported on 07/19/2015 06/06/15   Samantha Tripp Dowless, PA-C  guaiFENesin-dextromethorphan (ROBITUSSIN DM) 100-10 MG/5ML syrup Take 5 mLs by mouth 3 (three) times daily as needed for cough. Patient not taking: Reported on 07/19/2015 06/02/15   Benjiman Core, MD  levothyroxine (SYNTHROID, LEVOTHROID) 100 MCG tablet Take 100 mcg by mouth daily before breakfast.    Historical Provider, MD  ondansetron (ZOFRAN) 4 MG tablet Take 1  tablet (4 mg total) by mouth every 6 (six) hours as needed for nausea or vomiting. Patient not taking: Reported on 09/30/2016 07/19/15   Chase Picket Ward, PA-C    Family History No family history on file.  Social History Social History  Substance Use Topics  . Smoking status: Current Some Day Smoker    Types: Cigarettes  . Smokeless tobacco: Never Used  . Alcohol use Yes     Comment: occassionally     Allergies   Patient has no known allergies.   Review of Systems Review of Systems  All other systems negative except as documented in the HPI. All pertinent positives and negatives as reviewed in the HPI. Physical Exam Updated Vital Signs BP 147/88   Pulse 91   Temp 98.7 F (37.1 C) (Oral)   Resp 20   Ht 5' (1.524 m)   LMP 09/23/2016 (Approximate)   SpO2 99%   Physical Exam  Constitutional: She is oriented to person, place, and time. She appears well-developed and well-nourished. No distress.  HENT:  Head: Normocephalic and atraumatic.  Mouth/Throat: Oropharynx is clear and moist.  Eyes: Pupils are equal, round, and reactive to light.  Neck: Normal range of motion. Neck supple.  Cardiovascular: Normal rate, regular rhythm and normal heart sounds.  Exam reveals no gallop and no friction rub.   No murmur heard. Pulmonary/Chest: Effort normal and breath sounds normal. No respiratory distress. She has no wheezes.  Abdominal: Soft. Bowel sounds are normal. She exhibits no distension. There is no tenderness.  Neurological: She is alert and oriented to person, place, and time. She exhibits normal muscle tone. Coordination normal.  Skin: Skin is warm and dry. Capillary refill takes less than 2 seconds. No rash noted. No erythema.  Psychiatric: She has a normal mood and affect. Her behavior is normal.  Nursing note and vitals reviewed.    ED Treatments / Results  Labs (all labs ordered are listed, but only abnormal results are displayed) Labs Reviewed  COMPREHENSIVE  METABOLIC PANEL - Abnormal; Notable for the following:       Result Value   Potassium 3.0 (*)    Chloride 97 (*)    Glucose, Bld 164 (*)    Total Protein 8.7 (*)    Total Bilirubin 1.3 (*)    All other components within normal limits  CBC - Abnormal; Notable for the following:    WBC 11.5 (*)    All other components within normal limits  URINALYSIS, ROUTINE W REFLEX MICROSCOPIC - Abnormal; Notable for the following:    Color, Urine AMBER (*)    APPearance HAZY (*)    Glucose, UA 50 (*)    Hgb urine dipstick MODERATE (*)    Ketones, ur 5 (*)    Protein, ur >=300 (*)    Bacteria, UA RARE (*)    Squamous Epithelial / LPF 6-30 (*)    All other components within normal limits  LIPASE, BLOOD  I-STAT BETA HCG BLOOD, ED ([facility], [facility], [facility] ONLY)    EKG  EKG Interpretation None       Radiology Ct Renal Stone Study  Result Date: 09/30/2016 CLINICAL DATA:  Epigastric pain.  Vomiting and diarrhea. EXAM: CT ABDOMEN AND PELVIS WITHOUT CONTRAST TECHNIQUE: Multidetector CT imaging of the abdomen and pelvis was performed following the standard protocol without IV contrast. COMPARISON:  None. FINDINGS: Lower chest: No acute abnormality. Hepatobiliary: Liver and gallbladder are within normal limits for unenhanced state. Pancreas: Unremarkable. Spleen: Unremarkable. Adrenals/Urinary Tract: Kidneys and adrenal glands are within normal limits. No hydronephrosis or urinary calculus. Stomach/Bowel: Stomach is within normal limits. Normal appendix. No obvious focal mass involving the colon. No evidence of small-bowel obstruction. Vascular/Lymphatic: Mild atherosclerotic calcification involving the aorta and iliac arteries. Reproductive: Uterus and adnexa are within normal limits. Bladder is unremarkable. Other: There is no free fluid. Musculoskeletal: No vertebral compression deformity. IMPRESSION: No acute pathology. Electronically Signed   By: Jolaine Click M.D.   On: 09/30/2016 13:50    Procedures Procedures  (including critical care time)  Medications Ordered in ED Medications  ondansetron (ZOFRAN-ODT) disintegrating tablet 4 mg (4 mg Oral Given 09/30/16 0326)  sodium chloride 0.9 % bolus 1,000 mL (0 mLs Intravenous Stopped 09/30/16 1051)  morphine 4 MG/ML injection 4 mg (4 mg Intravenous Given 09/30/16 0924)  potassium chloride SA (K-DUR,KLOR-CON) CR tablet 40 mEq (40 mEq Oral Given 09/30/16 0924)  potassium chloride 30 mEq in sodium chloride 0.9 % 265 mL (KCL MULTIRUN) IVPB (30 mEq Intravenous Given 09/30/16 0957)  sodium chloride 0.9 % bolus 1,000 mL (0 mLs Intravenous Stopped 09/30/16 1206)  morphine 4 MG/ML injection 4 mg (4 mg Intravenous Given 09/30/16 1206)     Initial Impression / Assessment and Plan / ED Course  I have reviewed the triage vital signs and the nursing notes.  Pertinent labs & imaging results that were available during my care of the patient were reviewed by me and considered in  my medical decision making (see chart for details).   patient has gastroenteritis was given IV fluids and antiemetics and is feeling much better.  She states she feels able tolerate fluids.  Patient will be advised follow-up with her primary care Dr. told to return here as needed.  Patient agrees the plan and all questions were answered  Final Clinical Impressions(s) / ED Diagnoses   Final diagnoses:  Flank pain    New Prescriptions New Prescriptions   No medications on file     Charlestine NightChristopher Kristin Boehlke, PA-C 09/30/16 1442    Geoffery Lyonsouglas Delo, MD 09/30/16 1513

## 2016-09-30 NOTE — Discharge Instructions (Signed)
Return here as needed.  Increase your fluid intake slowly.  Also start with a bland diet over the next 24-48 hours.

## 2016-09-30 NOTE — ED Triage Notes (Signed)
Patient reports mid abdominal pain with multiple emesis and diarrhea onset yesterday , no fever or chills , hypertensive at triage .

## 2016-09-30 NOTE — ED Notes (Signed)
PA aware of pt continued high BP, no new orders

## 2017-03-16 ENCOUNTER — Encounter (HOSPITAL_COMMUNITY): Payer: Self-pay | Admitting: *Deleted

## 2017-03-16 ENCOUNTER — Emergency Department (HOSPITAL_COMMUNITY)
Admission: EM | Admit: 2017-03-16 | Discharge: 2017-03-16 | Disposition: A | Discharge status: Home / Self Care | Payer: 59 | Attending: Emergency Medicine | Admitting: Emergency Medicine

## 2017-03-16 DIAGNOSIS — E039 Hypothyroidism, unspecified: Secondary | ICD-10-CM | POA: Diagnosis not present

## 2017-03-16 DIAGNOSIS — F17210 Nicotine dependence, cigarettes, uncomplicated: Secondary | ICD-10-CM | POA: Insufficient documentation

## 2017-03-16 DIAGNOSIS — R197 Diarrhea, unspecified: Secondary | ICD-10-CM | POA: Insufficient documentation

## 2017-03-16 DIAGNOSIS — Z79891 Long term (current) use of opiate analgesic: Secondary | ICD-10-CM | POA: Insufficient documentation

## 2017-03-16 DIAGNOSIS — E860 Dehydration: Secondary | ICD-10-CM | POA: Insufficient documentation

## 2017-03-16 DIAGNOSIS — R112 Nausea with vomiting, unspecified: Secondary | ICD-10-CM

## 2017-03-16 LAB — URINALYSIS, ROUTINE W REFLEX MICROSCOPIC
BACTERIA UA: NONE SEEN
Bilirubin Urine: NEGATIVE
GLUCOSE, UA: 50 mg/dL — AB
KETONES UR: 20 mg/dL — AB
Leukocytes, UA: NEGATIVE
NITRITE: NEGATIVE
Protein, ur: 100 mg/dL — AB
Specific Gravity, Urine: 1.015 (ref 1.005–1.030)
pH: 6 (ref 5.0–8.0)

## 2017-03-16 LAB — COMPREHENSIVE METABOLIC PANEL
ALBUMIN: 4.7 g/dL (ref 3.5–5.0)
ALT: 18 U/L (ref 14–54)
AST: 22 U/L (ref 15–41)
Alkaline Phosphatase: 71 U/L (ref 38–126)
Anion gap: 11 (ref 5–15)
BUN: 8 mg/dL (ref 6–20)
CALCIUM: 9.5 mg/dL (ref 8.9–10.3)
CO2: 24 mmol/L (ref 22–32)
Chloride: 102 mmol/L (ref 101–111)
Creatinine, Ser: 0.84 mg/dL (ref 0.44–1.00)
GFR calc non Af Amer: >60 mL/min (ref >60)
GLUCOSE: 174 mg/dL — AB (ref 65–99)
POTASSIUM: 3.4 mmol/L — AB (ref 3.5–5.1)
SODIUM: 137 mmol/L (ref 135–145)
TOTAL PROTEIN: 8.5 g/dL — AB (ref 6.5–8.1)
Total Bilirubin: 0.8 mg/dL (ref 0.3–1.2)

## 2017-03-16 LAB — LIPASE, BLOOD: LIPASE: 23 U/L (ref 11–51)

## 2017-03-16 LAB — CBC
HEMATOCRIT: 40 % (ref 36.0–46.0)
HEMOGLOBIN: 13.5 g/dL (ref 12.0–15.0)
MCH: 31.8 pg (ref 26.0–34.0)
MCHC: 33.8 g/dL (ref 30.0–36.0)
MCV: 94.1 fL (ref 78.0–100.0)
Platelets: 250 10*3/uL (ref 150–400)
RBC: 4.25 MIL/uL (ref 3.87–5.11)
RDW: 12.8 % (ref 11.5–15.5)
WBC: 8.7 10*3/uL (ref 4.0–10.5)

## 2017-03-16 MED ORDER — SODIUM CHLORIDE 0.9 % IV BOLUS (SEPSIS)
1000.0000 mL | Freq: Once | INTRAVENOUS | Status: AC
  Administered 2017-03-16: 1000 mL via INTRAVENOUS

## 2017-03-16 MED ORDER — ONDANSETRON HCL 4 MG/2ML IJ SOLN
4.0000 mg | Freq: Once | INTRAMUSCULAR | Status: AC
  Administered 2017-03-16: 4 mg via INTRAVENOUS
  Filled 2017-03-16: qty 2

## 2017-03-16 MED ORDER — KETOROLAC TROMETHAMINE 30 MG/ML IJ SOLN
15.0000 mg | Freq: Once | INTRAMUSCULAR | Status: AC
  Administered 2017-03-16: 15 mg via INTRAVENOUS
  Filled 2017-03-16: qty 1

## 2017-03-16 MED ORDER — ONDANSETRON 4 MG PO TBDP
ORAL_TABLET | ORAL | Status: AC
  Administered 2017-03-16: 4 mg
  Filled 2017-03-16: qty 1

## 2017-03-16 MED ORDER — ONDANSETRON 4 MG PO TBDP: 4.0000 mg | ORAL_TABLET | Freq: Once | ORAL | Status: DC | PRN

## 2017-03-16 MED ORDER — ONDANSETRON HCL 4 MG PO TABS: 4.0000 mg | ORAL_TABLET | Freq: Three times a day (TID) | ORAL | 0 refills | Status: DC | PRN

## 2017-03-16 NOTE — ED Notes (Signed)
MD Aware of Pt headache.

## 2017-03-16 NOTE — ED Notes (Signed)
Pt asleep.

## 2017-03-16 NOTE — ED Triage Notes (Signed)
C/o nausea and vomiting x 24 hours.

## 2017-03-16 NOTE — ED Notes (Signed)
Pt aware of need for urine specimen. Unable to go at this time. 

## 2017-03-16 NOTE — Discharge Instructions (Signed)
Drink plenty of fluids (clear liquids) then start a bland diet later this morning such as toast, crackers, jello, [employer] chicken noodle soup. Use the zofran for nausea or vomiting. Take imodium OTC for diarrhea. Avoid milk products until the diarrhea is gone.Recheck if you get dehydrated again.

## 2017-03-16 NOTE — ED Provider Notes (Signed)
[facility]-EMERGENCY DEPT Provider Note   CSN: [id number] Arrival date & time: 03/16/17  4098 By signing my name below, I, Kristin Wiley, attest that this documentation has been prepared under the direction and in the presence of Kristin Albe, MD. Electronically Signed: Bridgette Wiley, ED Scribe. 03/16/17. 4:03 AM.  Time seen: 03:58 AM  History   Chief Complaint Chief Complaint  Patient presents with  . Emesis    HPI The history is provided by the patient. No language interpreter was used.   HPI Comments: Kristin Wiley is a 41 y.o. female with no pertinent PMHx, who presents to the Emergency Department complaining of nausea beginning one day ago with associated diffuse abdominal pain described as sharp and a soreness and, diarrhea only about 3 times described as loose and watery, episodic vomiting, diaphoresis, and generalized weakness. Pain is sharp in quality. She admits she went to a cookout for the fourth of July and notes she may have ate something to cause her symptoms there. No known sick contacts with similar symptoms. Pt denies fever, chills, or any other associated symptoms.She complains of feeling lightheaded and dizzy.  PCP: None has her first appointment soon Endocrinologist; Altheimer, Casimiro Needle, MD   Past Medical History:  Diagnosis Date  . Hypothyroidism     There are no active problems to display for this patient.   History reviewed. No pertinent surgical history.  OB History    Gravida Para Term Preterm AB Living   2 1 1   1 1    SAB TAB Ectopic Multiple Live Births     1     1       Home Medications    Prior to Admission medications   Medication Sig Start Date End Date Taking? Authorizing Provider  fluticasone (FLONASE) 50 MCG/ACT nasal spray Place 1 spray into both nostrils daily. Patient taking differently: Place 1 spray into both nostrils daily as needed for allergies.  06/06/15  Yes Dowless, Samantha Tripp, PA-C  guaiFENesin-dextromethorphan (ROBITUSSIN DM) 100-10  MG/5ML syrup Take 5 mLs by mouth 3 (three) times daily as needed for cough. 06/02/15  Yes Benjiman Core, MD  ibuprofen (ADVIL,MOTRIN) 200 MG tablet Take 600-800 mg by mouth every 6 (six) hours as needed for moderate pain.   Yes [provider]  levothyroxine (SYNTHROID, LEVOTHROID) 100 MCG tablet Take 100 mcg by mouth daily before breakfast.   Yes [provider]  promethazine (PHENERGAN) 25 MG tablet Take 1 tablet (25 mg total) by mouth every 8 (eight) hours as needed for nausea or vomiting. 09/30/16  Yes Lawyer, Cristal Deer, PA-C  ondansetron (ZOFRAN) 4 MG tablet Take 1 tablet (4 mg total) by mouth every 8 (eight) hours as needed for nausea or vomiting. 03/16/17   Kristin Albe, MD    Family History No family history on file.  Social History Social History  Substance Use Topics  . Smoking status: Current Some Day Smoker    Types: Cigarettes  . Smokeless tobacco: Never Used  . Alcohol use Yes     Comment: occassionally     Allergies   Patient has no known allergies.   Review of Systems Review of Systems  Constitutional: Positive for diaphoresis. Negative for chills and fever.  Gastrointestinal: Positive for abdominal pain, diarrhea, nausea and vomiting.  Neurological: Positive for weakness.  All other systems reviewed and are negative.    Physical Exam Updated Vital Signs BP (!) 183/97 (BP Location: Left Arm)   Pulse 71   Temp 98.3 F (  36.8 C) (Oral)   Resp 18   SpO2 96%   Vital signs normal except for hypertension   Physical Exam  Constitutional: She is oriented to person, place, and time. She appears well-developed and well-nourished.  Non-toxic appearance. She does not appear ill. No distress.  HENT:  Head: Normocephalic and atraumatic.  Right Ear: External ear normal.  Left Ear: External ear normal.  Nose: Nose normal. No mucosal edema or rhinorrhea.  Mouth/Throat: Oropharynx is clear and moist. Mucous membranes are dry. No dental abscesses or  uvula swelling.  Eyes: Conjunctivae and EOM are normal. Pupils are equal, round, and reactive to light.  Neck: Normal range of motion and full passive range of motion without pain. Neck supple.  Cardiovascular: Normal rate, regular rhythm and normal heart sounds.  Exam reveals no gallop and no friction rub.   No murmur heard. Pulmonary/Chest: Effort normal and breath sounds normal. No respiratory distress. She has no wheezes. She has no rhonchi. She has no rales. She exhibits no tenderness and no crepitus.  Abdominal: Soft. Normal appearance and bowel sounds are normal. She exhibits no distension. There is generalized tenderness. There is no rebound and no guarding.  Abdomen tender diffusely.  Musculoskeletal: Normal range of motion. She exhibits no edema or tenderness.  Moves all extremities well.   Neurological: She is alert and oriented to person, place, and time. She has normal strength. No cranial nerve deficit.  Skin: Skin is warm, dry and intact. No rash noted. No erythema. No pallor.  Psychiatric: She has a normal mood and affect. Her speech is normal and behavior is normal. Her mood appears not anxious.  Nursing note and vitals reviewed.    ED Treatments / Results  Labs (all labs ordered are listed, but only abnormal results are displayed) Results for orders placed or performed during the hospital encounter of 03/16/17  Lipase, blood  Result Value Ref Range   Lipase 23 11 - 51 U/L  Comprehensive metabolic panel  Result Value Ref Range   Sodium 137 135 - 145 mmol/L   Potassium 3.4 (L) 3.5 - 5.1 mmol/L   Chloride 102 101 - 111 mmol/L   CO2 24 22 - 32 mmol/L   Glucose, Bld 174 (H) 65 - 99 mg/dL   BUN 8 6 - 20 mg/dL   Creatinine, Ser 1.610.84 0.44 - 1.00 mg/dL   Calcium 9.5 8.9 - 09.610.3 mg/dL   Total Protein 8.5 (H) 6.5 - 8.1 g/dL   Albumin 4.7 3.5 - 5.0 g/dL   AST 22 15 - 41 U/L   ALT 18 14 - 54 U/L   Alkaline Phosphatase 71 38 - 126 U/L   Total Bilirubin 0.8 0.3 - 1.2 mg/dL    GFR calc non Af Amer >60 >60 mL/min   GFR calc Af Amer >60 >60 mL/min   Anion gap 11 5 - 15  CBC  Result Value Ref Range   WBC 8.7 4.0 - 10.5 K/uL   RBC 4.25 3.87 - 5.11 MIL/uL   Hemoglobin 13.5 12.0 - 15.0 g/dL   HCT 04.540.0 40.936.0 - 81.146.0 %   MCV 94.1 78.0 - 100.0 fL   MCH 31.8 26.0 - 34.0 pg   MCHC 33.8 30.0 - 36.0 g/dL   RDW 91.412.8 78.211.5 - 95.615.5 %   Platelets 250 150 - 400 K/uL   Laboratory interpretation all normal except mild hypokalemia    EKG  EKG Interpretation None       Radiology No results found.  Procedures Procedures (including critical care time)  Medications Ordered in ED Medications  ondansetron (ZOFRAN-ODT) disintegrating tablet 4 mg (not administered)  ondansetron (ZOFRAN-ODT) 4 MG disintegrating tablet (4 mg  Given 03/16/17 0348)  sodium chloride 0.9 % bolus 1,000 mL (0 mLs Intravenous Stopped 03/16/17 0726)  sodium chloride 0.9 % bolus 1,000 mL (0 mLs Intravenous Stopped 03/16/17 0725)  ondansetron (ZOFRAN) injection 4 mg (4 mg Intravenous Given 03/16/17 0431)  ketorolac (TORADOL) 30 MG/ML injection 15 mg (15 mg Intravenous Given 03/16/17 0604)  sodium chloride 0.9 % bolus 1,000 mL (1,000 mLs Intravenous New Bag/Given 03/16/17 0725)     Initial Impression / Assessment and Plan / ED Course  I have reviewed the triage vital signs and the nursing notes.  Pertinent labs & imaging results that were available during my care of the patient were reviewed by me and considered in my medical decision making (see chart for details).  Patient was started on IV fluids. She had been given Zofran ODT in triage, she was given Zofran IV.  Recheck at 6 AM patient states she still has some nausea and just diffuse body aches. She was given IV Toradol and an IV Zofran.  Recheck 7:10 AM patient's feeling much better. She feels the need to start having a output reaches given 1 more liter IV fluids and she is willing to try oral challenge.  Recheck at 8 AM patient has been able to drink  fluids and is feeling better. She was discharged.  Final Clinical Impressions(s) / ED Diagnoses   Final diagnoses:  Nausea vomiting and diarrhea  Dehydration    New Prescriptions New Prescriptions   ONDANSETRON (ZOFRAN) 4 MG TABLET    Take 1 tablet (4 mg total) by mouth every 8 (eight) hours as needed for nausea or vomiting.    I personally performed the services described in this documentation, which was scribed in my presence. The recorded information has been reviewed and considered.  Kristin Albe, MD, Concha Pyo, MD 03/16/17 [phone]

## 2017-07-10 ENCOUNTER — Emergency Department (HOSPITAL_COMMUNITY): Payer: 59

## 2017-07-10 ENCOUNTER — Emergency Department (HOSPITAL_COMMUNITY)
Admission: EM | Admit: 2017-07-10 | Discharge: 2017-07-10 | Disposition: A | Discharge status: Home / Self Care | Payer: 59 | Attending: Emergency Medicine | Admitting: Emergency Medicine

## 2017-07-10 DIAGNOSIS — Y939 Activity, unspecified: Secondary | ICD-10-CM | POA: Insufficient documentation

## 2017-07-10 DIAGNOSIS — F17210 Nicotine dependence, cigarettes, uncomplicated: Secondary | ICD-10-CM | POA: Diagnosis not present

## 2017-07-10 DIAGNOSIS — S39012A Strain of muscle, fascia and tendon of lower back, initial encounter: Secondary | ICD-10-CM | POA: Insufficient documentation

## 2017-07-10 DIAGNOSIS — Y999 Unspecified external cause status: Secondary | ICD-10-CM | POA: Diagnosis not present

## 2017-07-10 DIAGNOSIS — Y92410 Unspecified street and highway as the place of occurrence of the external cause: Secondary | ICD-10-CM | POA: Insufficient documentation

## 2017-07-10 DIAGNOSIS — Z79899 Other long term (current) drug therapy: Secondary | ICD-10-CM | POA: Diagnosis not present

## 2017-07-10 DIAGNOSIS — E039 Hypothyroidism, unspecified: Secondary | ICD-10-CM | POA: Insufficient documentation

## 2017-07-10 DIAGNOSIS — R0789 Other chest pain: Secondary | ICD-10-CM

## 2017-07-10 DIAGNOSIS — S161XXA Strain of muscle, fascia and tendon at neck level, initial encounter: Secondary | ICD-10-CM

## 2017-07-10 DIAGNOSIS — S199XXA Unspecified injury of neck, initial encounter: Secondary | ICD-10-CM | POA: Diagnosis present

## 2017-07-10 MED ORDER — IBUPROFEN 400 MG PO TABS
600.0000 mg | ORAL_TABLET | Freq: Once | ORAL | Status: AC
  Administered 2017-07-10: 10:00:00 600 mg via ORAL
  Filled 2017-07-10: qty 1

## 2017-07-10 MED ORDER — IBUPROFEN 600 MG PO TABS: 600.0000 mg | ORAL_TABLET | Freq: Four times a day (QID) | ORAL | 0 refills | Status: DC | PRN

## 2017-07-10 MED ORDER — METHOCARBAMOL 500 MG PO TABS: 500.0000 mg | ORAL_TABLET | Freq: Two times a day (BID) | ORAL | 0 refills | Status: DC
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
## 2017-07-10 NOTE — Discharge Instructions (Signed)
The pain your experiencing is likely due to muscle strain, you may take Ibuprofen and Robaxin as needed for pain management. Do not combine with any pain reliever other than tylenol. The muscle soreness should improve over the next week. Follow up with your family doctor in the next week for a recheck if you are still having symptoms. Return to ED if pain is worsening, you develop weakness or numbness of extremities, or new or concerning symptoms develop. °
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
## 2017-09-11 ENCOUNTER — Ambulatory Visit (HOSPITAL_COMMUNITY): Admission: EM | Admit: 2017-09-11 | Discharge: 2017-09-11 | Discharge status: Against Medical Advice | Payer: 59

## 2017-09-11 NOTE — ED Triage Notes (Signed)
Per pt access pt left 

## 2018-11-27 ENCOUNTER — Encounter (HOSPITAL_COMMUNITY): Payer: Self-pay

## 2018-11-27 ENCOUNTER — Other Ambulatory Visit: Payer: Self-pay

## 2018-11-27 ENCOUNTER — Ambulatory Visit (HOSPITAL_COMMUNITY)
Admission: EM | Admit: 2018-11-27 | Discharge: 2018-11-27 | Disposition: A | Discharge status: Home / Self Care | Payer: 59 | Attending: Family Medicine | Admitting: Family Medicine

## 2018-11-27 DIAGNOSIS — J3089 Other allergic rhinitis: Secondary | ICD-10-CM | POA: Diagnosis not present

## 2018-11-27 MED ORDER — FLUTICASONE PROPIONATE 50 MCG/ACT NA SUSP: 2.0000 | Freq: Every day | NASAL | 0 refills | Status: DC

## 2018-11-27 MED ORDER — CETIRIZINE HCL 10 MG PO TABS: 10.0000 mg | ORAL_TABLET | Freq: Every day | ORAL | 0 refills | Status: DC

## 2018-11-27 MED ORDER — FLUTICASONE PROPIONATE 50 MCG/ACT NA SUSP
2.0000 | Freq: Every day | NASAL | 0 refills | Status: DC
Start: 2018-11-27 — End: 2020-02-06

## 2018-11-27 MED ORDER — IPRATROPIUM BROMIDE 0.06 % NA SOLN: 2.0000 | Freq: Four times a day (QID) | NASAL | 0 refills | Status: DC

## 2018-11-27 NOTE — ED Provider Notes (Signed)
[facility]    CSN: [id number] Arrival date & time: 11/27/18  1322     History   Chief Complaint Chief Complaint  Patient presents with  . Sore Throat    HPI Kristin Wiley is a 43 y.o. female.   43 year old female comes in for few day history of URI symptoms. States as she was around possible flu contact and has URI symptoms, work requires her to be evaluated prior to returning to work. She feels that her symptoms are more due to allergies, with rhinorrhea, nasal congestion, sore throat, headache, sneezing. She denies cough. Denies fever, chills, night sweats. Current some day smoker. Started zyrtec yesterday.      Past Medical History:  Diagnosis Date  . Hypothyroidism     There are no active problems to display for this patient.   History reviewed. No pertinent surgical history.  OB History    Gravida  2   Para  1   Term  1   Preterm      AB  1   Living  1     SAB      TAB  1   Ectopic      Multiple      Live Births  1            Home Medications    Prior to Admission medications   Medication Sig Start Date End Date Taking? Authorizing Provider  cetirizine (ZYRTEC) 10 MG tablet Take 1 tablet (10 mg total) by mouth daily. 11/27/18   Cathie Hoops, Miran Kautzman V, PA-C  fluticasone (FLONASE) 50 MCG/ACT nasal spray Place 2 sprays into both nostrils daily. 11/27/18   Cathie Hoops, Jessina Marse V, PA-C  guaiFENesin-dextromethorphan (ROBITUSSIN DM) 100-10 MG/5ML syrup Take 5 mLs by mouth 3 (three) times daily as needed for cough. 06/02/15   Benjiman Core, MD  ibuprofen (ADVIL,MOTRIN) 200 MG tablet Take 600-800 mg by mouth every 6 (six) hours as needed for moderate pain.    [provider]  ibuprofen (ADVIL,MOTRIN) 600 MG tablet Take 1 tablet (600 mg total) by mouth every 6 (six) hours as needed. 07/10/17   Dartha Lodge, PA-C  ipratropium (ATROVENT) 0.06 % nasal spray Place 2 sprays into both nostrils 4 (four) times daily. 11/27/18   Cathie Hoops, Erika Hussar V, PA-C  levothyroxine  (SYNTHROID, LEVOTHROID) 100 MCG tablet Take 100 mcg by mouth daily before breakfast.    [provider]  methocarbamol (ROBAXIN) 500 MG tablet Take 1 tablet (500 mg total) by mouth 2 (two) times daily. 07/10/17   Dartha Lodge, PA-C  ondansetron (ZOFRAN) 4 MG tablet Take 1 tablet (4 mg total) by mouth every 8 (eight) hours as needed for nausea or vomiting. 03/16/17   Devoria Albe, MD  promethazine (PHENERGAN) 25 MG tablet Take 1 tablet (25 mg total) by mouth every 8 (eight) hours as needed for nausea or vomiting. 09/30/16   Charlestine Night, PA-C    Family History History reviewed. No pertinent family history.  Social History Social History   Tobacco Use  . Smoking status: Current Some Day Smoker    Types: Cigarettes  . Smokeless tobacco: Never Used  Substance Use Topics  . Alcohol use: Yes    Comment: occassionally  . Drug use: No     Allergies   Patient has no known allergies.   Review of Systems Review of Systems  Reason unable to perform ROS: See HPI as above.     Physical Exam Triage Vital Signs  ED Triage Vitals  Enc Vitals Group     BP 11/27/18 1340 138/72     Pulse Rate 11/27/18 1340 68     Resp 11/27/18 1340 16     Temp 11/27/18 1340 98.2 F (36.8 C)     Temp Source 11/27/18 1340 Oral     SpO2 11/27/18 1340 100 %     Weight 11/27/18 1343 150 lb (68 kg)     Height --      Head Circumference --      Peak Flow --      Pain Score 11/27/18 1343 3     Pain Loc --      Pain Edu? --      Excl. in GC? --    No data found.  Updated Vital Signs BP 138/72 (BP Location: Left Arm)   Pulse 68   Temp 98.2 F (36.8 C) (Oral)   Resp 16   Wt 150 lb (68 kg)   LMP 11/15/2018   SpO2 100%   BMI 29.29 kg/m   Physical Exam Constitutional:      General: She is not in acute distress.    Appearance: She is well-developed. She is not ill-appearing, toxic-appearing or diaphoretic.  HENT:     Head: Normocephalic and atraumatic.     Right Ear: Tympanic  membrane, ear canal and external ear normal. Tympanic membrane is not erythematous or bulging.     Left Ear: Tympanic membrane, ear canal and external ear normal. Tympanic membrane is not erythematous or bulging.     Nose: Rhinorrhea present.     Right Sinus: No maxillary sinus tenderness or frontal sinus tenderness.     Left Sinus: No maxillary sinus tenderness or frontal sinus tenderness.     Mouth/Throat:     Mouth: Mucous membranes are moist.     Pharynx: Oropharynx is clear. Uvula midline.  Eyes:     Conjunctiva/sclera: Conjunctivae normal.     Pupils: Pupils are equal, round, and reactive to light.  Neck:     Musculoskeletal: Normal range of motion and neck supple.  Cardiovascular:     Rate and Rhythm: Normal rate and regular rhythm.     Heart sounds: Normal heart sounds. No murmur. No friction rub. No gallop.   Pulmonary:     Effort: Pulmonary effort is normal. No accessory muscle usage, prolonged expiration, respiratory distress or retractions.     Breath sounds: Normal breath sounds. No stridor, decreased air movement or transmitted upper airway sounds. No decreased breath sounds, wheezing, rhonchi or rales.  Skin:    General: Skin is warm and dry.  Neurological:     Mental Status: She is alert and oriented to person, place, and time.      UC Treatments / Results  Labs (all labs ordered are listed, but only abnormal results are displayed) Labs Reviewed - No data to display  EKG None  Radiology No results found.  Procedures Procedures (including critical care time)  Medications Ordered in UC Medications - No data to display  Initial Impression / Assessment and Plan / UC Course  I have reviewed the triage vital signs and the nursing notes.  Pertinent labs & imaging results that were available during my care of the patient were reviewed by me and considered in my medical decision making (see chart for details).    Discussed allergic rhinitis vs viral URI. Low  risk for COVID, flu. Symptomatic treatment discussed. Push fluids. Return precautions given. Patient expresses understanding  and agrees to plan.  Final Clinical Impressions(s) / UC Diagnoses   Final diagnoses:  Seasonal allergic rhinitis due to other allergic trigger    ED Prescriptions    Medication Sig Dispense Auth. Provider   fluticasone (FLONASE) 50 MCG/ACT nasal spray  (Status: Discontinued) Place 2 sprays into both nostrils daily. 1 g Kalianne Fetting V, PA-C   ipratropium (ATROVENT) 0.06 % nasal spray  (Status: Discontinued) Place 2 sprays into both nostrils 4 (four) times daily. 15 mL Cammeron Greis V, PA-C   cetirizine (ZYRTEC) 10 MG tablet  (Status: Discontinued) Take 1 tablet (10 mg total) by mouth daily. 30 tablet Ludell Zacarias V, PA-C   cetirizine (ZYRTEC) 10 MG tablet Take 1 tablet (10 mg total) by mouth daily. 30 tablet Gavrielle Streck V, PA-C   fluticasone (FLONASE) 50 MCG/ACT nasal spray Place 2 sprays into both nostrils daily. 1 g Lovelace Cerveny V, PA-C   ipratropium (ATROVENT) 0.06 % nasal spray Place 2 sprays into both nostrils 4 (four) times daily. 15 mL Threasa Alpha, [place] 11/27/18 1412

## 2018-11-27 NOTE — Discharge Instructions (Signed)
Start zyrtec as directed. Start flonase, atrovent nasal spray for nasal congestion/drainage. You can use over the counter nasal saline rinse such as neti pot for nasal congestion. Keep hydrated, your urine should be clear to pale yellow in color. Tylenol/motrin for fever and pain. Monitor for any worsening of symptoms, chest pain, shortness of breath, wheezing, swelling of the throat, follow up for reevaluation.

## 2018-11-27 NOTE — ED Triage Notes (Signed)
Pt cc pt thinks that she has been around a person that was sick with the flu. Pt cc sore throat and runny pt thinks her allergies are acting up. Pt states she works a lot of hours she's very tierd from working.

## 2020-01-06 ENCOUNTER — Emergency Department (HOSPITAL_COMMUNITY): Payer: 59

## 2020-01-06 ENCOUNTER — Other Ambulatory Visit: Payer: Self-pay

## 2020-01-06 ENCOUNTER — Emergency Department (HOSPITAL_COMMUNITY)
Admission: EM | Admit: 2020-01-06 | Discharge: 2020-01-06 | Disposition: A | Discharge status: Home / Self Care | Payer: 59 | Attending: Emergency Medicine | Admitting: Emergency Medicine

## 2020-01-06 ENCOUNTER — Encounter (HOSPITAL_COMMUNITY): Payer: Self-pay

## 2020-01-06 DIAGNOSIS — E039 Hypothyroidism, unspecified: Secondary | ICD-10-CM | POA: Insufficient documentation

## 2020-01-06 DIAGNOSIS — F17210 Nicotine dependence, cigarettes, uncomplicated: Secondary | ICD-10-CM | POA: Insufficient documentation

## 2020-01-06 DIAGNOSIS — Z79899 Other long term (current) drug therapy: Secondary | ICD-10-CM | POA: Insufficient documentation

## 2020-01-06 DIAGNOSIS — R0789 Other chest pain: Secondary | ICD-10-CM

## 2020-01-06 LAB — CBC
HCT: 41.1 % (ref 36.0–46.0)
Hemoglobin: 13.2 g/dL (ref 12.0–15.0)
MCH: 32.6 pg (ref 26.0–34.0)
MCHC: 32.1 g/dL (ref 30.0–36.0)
MCV: 101.5 fL — ABNORMAL HIGH (ref 80.0–100.0)
Platelets: 240 10*3/uL (ref 150–400)
RBC: 4.05 MIL/uL (ref 3.87–5.11)
RDW: 12.9 % (ref 11.5–15.5)
WBC: 4.5 10*3/uL (ref 4.0–10.5)
nRBC: 0 % (ref 0.0–0.2)

## 2020-01-06 LAB — BASIC METABOLIC PANEL
Anion gap: 11 (ref 5–15)
BUN: 7 mg/dL (ref 6–20)
CO2: 21 mmol/L — ABNORMAL LOW (ref 22–32)
Calcium: 9.2 mg/dL (ref 8.9–10.3)
Chloride: 107 mmol/L (ref 98–111)
Creatinine, Ser: 0.88 mg/dL (ref 0.44–1.00)
GFR calc Af Amer: >60 mL/min (ref >60)
GFR calc non Af Amer: >60 mL/min (ref >60)
Glucose, Bld: 115 mg/dL — ABNORMAL HIGH (ref 70–99)
Potassium: 3.8 mmol/L (ref 3.5–5.1)
Sodium: 139 mmol/L (ref 135–145)

## 2020-01-06 LAB — TROPONIN I (HIGH SENSITIVITY): Troponin I (High Sensitivity): <2 ng/L (ref <18)

## 2020-01-06 LAB — I-STAT BETA HCG BLOOD, ED (MC, WL, AP ONLY): I-stat hCG, quantitative: <5 m[IU]/mL (ref <5)

## 2020-01-06 MED ORDER — SODIUM CHLORIDE 0.9% FLUSH: 3.0000 mL | Freq: Once | INTRAVENOUS | Status: DC

## 2020-01-06 MED ORDER — CELECOXIB 200 MG PO CAPS: 200.0000 mg | ORAL_CAPSULE | Freq: Two times a day (BID) | ORAL | 0 refills | Status: AC

## 2020-01-06 NOTE — Discharge Instructions (Addendum)
I have prescribed you Celebrex.  Please take, as written.  Discontinue immediately should you become pregnant or breast-feeding.  You may also apply a topical pain gel that you can obtain over-the-counter at a pharmacy.  Tylenol can be taken for breakthrough pain.  Please follow-up with your primary care provider regarding today's encounter.

## 2020-01-06 NOTE — ED Triage Notes (Signed)
Patient complains of intermittent chest pain sinced Saturday. Reports pain worse with ROM and describes as positional. NAD

## 2020-01-06 NOTE — ED Provider Notes (Signed)
[facility] EMERGENCY DEPARTMENT Provider Note   CSN: [id number] Arrival date & time: 01/06/20  2951     History No chief complaint on file.   Kristin Wiley is a 44 y.o. female with PMH significant for hypothyroidism on Synthroid who presents to the ED with a 9-day history of reproducible anterior chest wall discomfort.  Patient reports that her chest discomfort is middle and right-sided described as "achy".  She states that is worse with leaning forward, torso rotation, and palpation.  She states that it is basically constant.  She has not taken anything for her symptoms.  She feels as though she pulled a muscle, however given the chronicity of her discomfort she wanted to come here to the ED for evaluation.  She smokes tobacco, one third of a pack per day.  She has continued to smoke throughout duration of her chest discomfort.  She denies any recent illness, fevers or chills, headache or dizziness, shortness of breath or cough, exertional chest pain, palpitations, history of clots, pleuritic cough or pleuritic chest discomfort, family history of premature cardiac death, abdominal pain, nausea or vomiting, or other symptoms.  She does state that her anxiety has been increased recently due to the 2-year anniversary of her mother's death.  She has a primary care provider, but she was unsure as to whether or not they would be able to accommodate her.  HPI     Past Medical History:  Diagnosis Date  . Hypothyroidism     There are no problems to display for this patient.   History reviewed. No pertinent surgical history.   OB History    Gravida  2   Para  1   Term  1   Preterm      AB  1   Living  1     SAB      TAB  1   Ectopic      Multiple      Live Births  1           No family history on file.  Social History   Tobacco Use  . Smoking status: Current Some Day Smoker    Types: Cigarettes  . Smokeless tobacco: Never Used  Substance Use  Topics  . Alcohol use: Yes    Comment: occassionally  . Drug use: No    Home Medications Prior to Admission medications   Medication Sig Start Date End Date Taking? Authorizing Provider  celecoxib (CELEBREX) 200 MG capsule Take 1 capsule (200 mg total) by mouth 2 (two) times daily for 14 days. 01/06/20 01/20/20  Corena Herter, PA-C  cetirizine (ZYRTEC) 10 MG tablet Take 1 tablet (10 mg total) by mouth daily. 11/27/18   Tasia Catchings, Amy V, PA-C  fluticasone (FLONASE) 50 MCG/ACT nasal spray Place 2 sprays into both nostrils daily. 11/27/18   Tasia Catchings, Amy V, PA-C  guaiFENesin-dextromethorphan (ROBITUSSIN DM) 100-10 MG/5ML syrup Take 5 mLs by mouth 3 (three) times daily as needed for cough. 06/02/15   Davonna Belling, MD  ibuprofen (ADVIL,MOTRIN) 200 MG tablet Take 600-800 mg by mouth every 6 (six) hours as needed for moderate pain.    [provider]  ibuprofen (ADVIL,MOTRIN) 600 MG tablet Take 1 tablet (600 mg total) by mouth every 6 (six) hours as needed. 07/10/17   Jacqlyn Larsen, PA-C  ipratropium (ATROVENT) 0.06 % nasal spray Place 2 sprays into both nostrils 4 (four) times daily. 11/27/18   Ok Edwards, PA-C  levothyroxine (SYNTHROID, LEVOTHROID) 100 MCG tablet Take 100 mcg by mouth daily before breakfast.    [provider]  methocarbamol (ROBAXIN) 500 MG tablet Take 1 tablet (500 mg total) by mouth 2 (two) times daily. 07/10/17   Dartha Lodge, PA-C  ondansetron (ZOFRAN) 4 MG tablet Take 1 tablet (4 mg total) by mouth every 8 (eight) hours as needed for nausea or vomiting. 03/16/17   Devoria Albe, MD  promethazine (PHENERGAN) 25 MG tablet Take 1 tablet (25 mg total) by mouth every 8 (eight) hours as needed for nausea or vomiting. 09/30/16   Charlestine Night, PA-C    Allergies    Patient has no known allergies.  Review of Systems   Review of Systems  Constitutional: Negative for chills and fever.  Respiratory: Negative for cough, shortness of breath and wheezing.   Cardiovascular:  Positive for chest pain. Negative for palpitations and leg swelling.  Gastrointestinal: Negative for abdominal pain and nausea.  Musculoskeletal: Negative for back pain.  Neurological: Negative for weakness.    Physical Exam Updated Vital Signs BP 122/72 (BP Location: Right Arm)   Pulse 80   Temp 98.5 F (36.9 C) (Oral)   Resp 14   Ht 5' (1.524 m)   Wt 54.4 kg   LMP 12/17/2019   SpO2 100%   BMI 23.44 kg/m   Physical Exam Vitals and nursing note reviewed. Exam conducted with a chaperone present.  Constitutional:      Appearance: Normal appearance.  HENT:     Head: Normocephalic and atraumatic.  Eyes:     General: No scleral icterus.    Conjunctiva/sclera: Conjunctivae normal.  Cardiovascular:     Rate and Rhythm: Normal rate and regular rhythm.     Pulses: Normal pulses.     Heart sounds: Normal heart sounds.     Comments: Pulses equal bilaterally. Pulmonary:     Effort: Pulmonary effort is normal. No respiratory distress.     Breath sounds: Normal breath sounds. No wheezing or rales.     Comments: Reproducible anterior chest wall discomfort with palpation and flexion at waist. Abdominal:     General: Abdomen is flat. There is no distension.     Palpations: Abdomen is soft.     Tenderness: There is no abdominal tenderness. There is no guarding.  Musculoskeletal:        General: Normal range of motion.     Cervical back: Normal range of motion. No rigidity.  Skin:    General: Skin is dry.     Capillary Refill: Capillary refill takes less than 2 seconds.  Neurological:     Mental Status: She is alert and oriented to person, place, and time.     GCS: GCS eye subscore is 4. GCS verbal subscore is 5. GCS motor subscore is 6.  Psychiatric:        Mood and Affect: Mood normal.        Behavior: Behavior normal.        Thought Content: Thought content normal.     ED Results / Procedures / Treatments   Labs (all labs ordered are listed, but only abnormal results are  displayed) Labs Reviewed  BASIC METABOLIC PANEL - Abnormal; Notable for the following components:      Result Value   CO2 21 (*)    Glucose, Bld 115 (*)    All other components within normal limits  CBC - Abnormal; Notable for the following components:   MCV 101.5 (*)  All other components within normal limits  I-STAT BETA HCG BLOOD, ED ([facility], [facility], [facility] ONLY)  TROPONIN I (HIGH SENSITIVITY)    EKG None  Radiology DG Chest 2 View  Result Date: 01/06/2020 CLINICAL DATA:  Chest pain EXAM: CHEST - 2 VIEW COMPARISON:  07/10/2017 FINDINGS: Normal heart size and mediastinal contours. No acute infiltrate or edema. Artifact from EKG leads. No effusion or pneumothorax. No acute osseous findings. IMPRESSION: Negative chest. Electronically Signed   By: Marnee Spring M.D.   On: 01/06/2020 07:56    Procedures Procedures (including critical care time)  Medications Ordered in ED Medications  sodium chloride flush (NS) 0.9 % injection 3 mL (has no administration in time range)    ED Course  I have reviewed the triage vital signs and the nursing notes.  Pertinent labs & imaging results that were available during my care of the patient were reviewed by me and considered in my medical decision making (see chart for details).    MDM Rules/Calculators/A&P                      Patient's history and physical exam is consistent with anterior chest wall discomfort with musculoskeletal etiology.  Her symptoms are reproducible with palpation and with certain movements.  She states that it feels like a "muscle ache", but she is uncertain of what specifically caused it.  She works as a Estate agent, but does not recall any specific lifting or increased exertion that may have precipitated her symptoms of chest discomfort.  Patient has not been taking any medications for her chest wall pain.  She denies any history of PUD or CKD.  Will write patient a prescription for Celebrex 200 mg twice daily.   Patient to follow-up with her primary care provider for ongoing evaluation and management.  She states that she will call them as soon as she is discharged here today.  She denies any history of cardiac disease and she denies any family history of premature cardiac death.  No history of clots.  Low suspicion for PE, dissection, or ACS.  EKG is interpreted and demonstrates normal sinus rhythm with no evidence of ischemia.  Her initial troponin obtained is <2 and given chronicity of her positive chest discomfort there is no necessity to trend.  Her BMP and CBC is also interpreted and is unremarkable.    Patient is reassured by today's comprehensive work-up.  Strict ED return precautions discussed.  All of the evaluation and work-up results were discussed with the patient and any family at bedside. They were provided opportunity to ask any additional questions and have none at this time. They have expressed understanding of verbal discharge instructions as well as return precautions and are agreeable to the plan.    Final Clinical Impression(s) / ED Diagnoses Final diagnoses:  Anterior chest wall pain    Rx / DC Orders ED Discharge Orders         Ordered    celecoxib (CELEBREX) 200 MG capsule  2 times daily     01/06/20 1209           Elvera Maria 01/06/20 1214    Linwood Dibbles, MD 01/07/20 2229    Linwood Dibbles, MD 01/07/20 [phone]

## 2020-02-01 ENCOUNTER — Encounter (HOSPITAL_COMMUNITY): Payer: Self-pay | Admitting: *Deleted

## 2020-02-01 ENCOUNTER — Emergency Department (HOSPITAL_COMMUNITY)
Admission: EM | Admit: 2020-02-01 | Discharge: 2020-02-01 | Disposition: A | Discharge status: Home / Self Care | Payer: Self-pay | Attending: Emergency Medicine | Admitting: Emergency Medicine

## 2020-02-01 ENCOUNTER — Other Ambulatory Visit: Payer: Self-pay

## 2020-02-01 ENCOUNTER — Emergency Department (HOSPITAL_COMMUNITY): Payer: Self-pay

## 2020-02-01 DIAGNOSIS — W208XXA Other cause of strike by thrown, projected or falling object, initial encounter: Secondary | ICD-10-CM | POA: Insufficient documentation

## 2020-02-01 DIAGNOSIS — Y929 Unspecified place or not applicable: Secondary | ICD-10-CM | POA: Insufficient documentation

## 2020-02-01 DIAGNOSIS — S0230XA Fracture of orbital floor, unspecified side, initial encounter for closed fracture: Secondary | ICD-10-CM | POA: Insufficient documentation

## 2020-02-01 DIAGNOSIS — Y999 Unspecified external cause status: Secondary | ICD-10-CM | POA: Insufficient documentation

## 2020-02-01 DIAGNOSIS — E039 Hypothyroidism, unspecified: Secondary | ICD-10-CM | POA: Insufficient documentation

## 2020-02-01 DIAGNOSIS — Y939 Activity, unspecified: Secondary | ICD-10-CM | POA: Insufficient documentation

## 2020-02-01 DIAGNOSIS — F17210 Nicotine dependence, cigarettes, uncomplicated: Secondary | ICD-10-CM | POA: Insufficient documentation

## 2020-02-01 DIAGNOSIS — Z79899 Other long term (current) drug therapy: Secondary | ICD-10-CM | POA: Insufficient documentation

## 2020-02-01 MED ORDER — HYDROCODONE-ACETAMINOPHEN 5-325 MG PO TABS
1.0000 | ORAL_TABLET | Freq: Once | ORAL | Status: AC
  Administered 2020-02-01: 1 via ORAL
  Filled 2020-02-01: qty 1

## 2020-02-01 MED ORDER — HYDROCODONE-ACETAMINOPHEN 5-325 MG PO TABS: 1.0000 | ORAL_TABLET | ORAL | 0 refills | Status: DC | PRN

## 2020-02-01 NOTE — ED Notes (Signed)
Patient verbalizes understanding of discharge instructions . Opportunity for questions and answers were provided . Armband removed by staff ,Pt discharged from ED. W/C  offered at D/C  and Declined W/C at D/C and was escorted to lobby by RN.  
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
## 2020-02-01 NOTE — ED Provider Notes (Signed)
[facility] EMERGENCY DEPARTMENT Provider Note   CSN: [id number] Arrival date & time: 02/01/20  0944     History Chief Complaint  Patient presents with  . Eye Problem    Kristin Wiley is a 44 y.o. female.  44 year old female presents with complaint of pain around her right eye.  Patient states that 2 days ago she was getting a box down and the box fell and struck her in the right eye area.  Patient was wearing safety glasses at the time, denies loss of consciousness, did not fall to the floor.  Patient reports normal vision in the eye however has had persistent swelling and bruising in the area which concerned her and prompted her visit today.  Also reports pain with movement of her eye superiorly as well as temporally.  Patient does not wear glasses or contacts.  No other injuries or concerns.        Past Medical History:  Diagnosis Date  . Hypothyroidism     There are no problems to display for this patient.   History reviewed. No pertinent surgical history.   OB History    Gravida  2   Para  1   Term  1   Preterm      AB  1   Living  1     SAB      TAB  1   Ectopic      Multiple      Live Births  1           History reviewed. No pertinent family history.  Social History   Tobacco Use  . Smoking status: Current Some Day Smoker    Types: Cigarettes  . Smokeless tobacco: Never Used  Substance Use Topics  . Alcohol use: Yes    Comment: occassionally  . Drug use: No    Home Medications Prior to Admission medications   Medication Sig Start Date End Date Taking? Authorizing Provider  cetirizine (ZYRTEC) 10 MG tablet Take 1 tablet (10 mg total) by mouth daily. 11/27/18   Tasia Catchings, Amy V, PA-C  fluticasone (FLONASE) 50 MCG/ACT nasal spray Place 2 sprays into both nostrils daily. 11/27/18   Tasia Catchings, Amy V, PA-C  guaiFENesin-dextromethorphan (ROBITUSSIN DM) 100-10 MG/5ML syrup Take 5 mLs by mouth 3 (three) times daily as needed for cough.  06/02/15   Davonna Belling, MD  HYDROcodone-acetaminophen (NORCO/VICODIN) 5-325 MG tablet Take 1 tablet by mouth every 4 (four) hours as needed. 02/01/20   Tacy Learn, PA-C  ibuprofen (ADVIL,MOTRIN) 200 MG tablet Take 600-800 mg by mouth every 6 (six) hours as needed for moderate pain.    [provider]  ibuprofen (ADVIL,MOTRIN) 600 MG tablet Take 1 tablet (600 mg total) by mouth every 6 (six) hours as needed. 07/10/17   Jacqlyn Larsen, PA-C  ipratropium (ATROVENT) 0.06 % nasal spray Place 2 sprays into both nostrils 4 (four) times daily. 11/27/18   Tasia Catchings, Amy V, PA-C  levothyroxine (SYNTHROID, LEVOTHROID) 100 MCG tablet Take 100 mcg by mouth daily before breakfast.    [provider]  methocarbamol (ROBAXIN) 500 MG tablet Take 1 tablet (500 mg total) by mouth 2 (two) times daily. 07/10/17   Jacqlyn Larsen, PA-C  ondansetron (ZOFRAN) 4 MG tablet Take 1 tablet (4 mg total) by mouth every 8 (eight) hours as needed for nausea or vomiting. 03/16/17   Rolland Porter, MD  promethazine (PHENERGAN) 25 MG tablet Take 1 tablet (25 mg total) by  mouth every 8 (eight) hours as needed for nausea or vomiting. 09/30/16   Charlestine Night, PA-C    Allergies    Patient has no known allergies.  Review of Systems   Review of Systems  Constitutional: Negative for fever.  HENT: Positive for facial swelling.   Eyes: Positive for pain and redness. Negative for photophobia and visual disturbance.  Musculoskeletal: Negative for neck pain and neck stiffness.  Skin: Negative for wound.  Neurological: Negative for weakness, numbness and headaches.  Hematological: Does not bruise/bleed easily.  Psychiatric/Behavioral: Negative for confusion.  All other systems reviewed and are negative.   Physical Exam Updated Vital Signs BP 119/71 (BP Location: Right Arm)   Pulse (!) 57   Temp 98.6 F (37 C) (Oral)   Resp 15   Ht 5' (1.524 m)   Wt 49.9 kg   LMP 01/10/2020   SpO2 100%   BMI 21.48 kg/m    Physical Exam Vitals and nursing note reviewed.  Constitutional:      General: She is not in acute distress.    Appearance: She is well-developed. She is not diaphoretic.  HENT:     Head: Normocephalic.     Nose: Nose normal.     Mouth/Throat:     Mouth: Mucous membranes are moist.  Eyes:     Extraocular Movements: Extraocular movements intact.     Conjunctiva/sclera:     Right eye: Hemorrhage present.     Pupils: Pupils are equal, round, and reactive to light.     Slit lamp exam:    Right eye: No hyphema or photophobia.     Left eye: No photophobia.     Comments: Periorbital swelling with ecchymosis with tenderness to superior and inferior orbital rim without crepitus or step-off.  Swelling and tenderness limit exam, extraocular movements intact without evidence of entrapment.  Some conjunctival hemorrhage noted nasally and temporally.  Anterior chamber appears clear.  Pulmonary:     Effort: Pulmonary effort is normal.  Musculoskeletal:     Cervical back: Normal range of motion and neck supple. Tenderness present.  Skin:    General: Skin is warm and dry.     Coloration: Skin is jaundiced.     Findings: No erythema or rash.  Neurological:     Mental Status: She is alert and oriented to person, place, and time.  Psychiatric:        Behavior: Behavior normal.     ED Results / Procedures / Treatments   Labs (all labs ordered are listed, but only abnormal results are displayed) Labs Reviewed - No data to display  EKG None  Radiology CT OrbitsS W/O CM  Result Date: 02/01/2020 CLINICAL DATA:  Trauma to right eye, pain, blurred vision EXAM: CT ORBITS WITHOUT CONTRAST TECHNIQUE: Multidetector CT images were obtained using the standard protocol without intravenous contrast. COMPARISON:  None. FINDINGS: Orbits:  Right medial and inferior orbital wall blowout fractures. Right globe is intact. Overlying preseptal soft tissue swelling (series 3/image 16). Herniation of the right  medial rectus muscle (coronal image 31). Significant depression of the orbital floor, with a rounded appearance of the inferior rectus muscle (coronal image 32), suggesting tension, with the possibility of early entrapment. Visualized sinuses: Hemorrhage within the right maxillary sinus. Partial opacification of the right ethmoid sinuses. Mastoid air cells are clear. Soft tissues: Visualized soft tissues are otherwise grossly unremarkable. Limited intracranial: Within normal limits. IMPRESSION: Right medial and inferior orbital wall blowout fractures. Herniation of the right medial rectus  muscle. Rounded appearance of the inferior rectus muscle, suggesting tension with the possibility of early entrapment. These results were called by telephone at the time of interpretation on 02/01/2020 at 12:07 pm to provider [facility] , who verbally acknowledged these results. Electronically Signed   By: Charline Bills M.D.   On: 02/01/2020 12:08    Procedures Procedures (including critical care time)  Medications Ordered in ED Medications  HYDROcodone-acetaminophen (NORCO/VICODIN) 5-325 MG per tablet 1 tablet (has no administration in time range)    ED Course  I have reviewed the triage vital signs and the nursing notes.  Pertinent labs & imaging results that were available during my care of the patient were reviewed by me and considered in my medical decision making (see chart for details).  Clinical Course as of Jan 31 1254  Sun Feb 01, 2020  5778 44 year old female with right eye injury which occurred 2 days ago.  On exam has periorbital swelling and ecchymosis with tenderness without step-off or crepitus.  Extraocular movements intact however pain significant with moving eye temporally or superiorly.  Gross vision intact.  CT with concern for orbital blowout fractures with possible entrapment.  Case was discussed with Dr. Pollyann Kennedy on-call with ENT, discussed blowout fracture with concern for entrapment,  recommends outpatient follow-up this week, advised patient not to blow nose.  Patient was given prescription for Norco for pain.  Advised to contact Dr. Lucky Rathke office tomorrow morning for follow-up.  Patient understands importance of follow-up to ensure proper healing of fracture as well as concern for entrapment of eye muscles.   [LM]    Clinical Course User Index [LM] Alden Hipp   MDM Rules/Calculators/A&P                      Final Clinical Impression(s) / ED Diagnoses Final diagnoses:  Closed blow out fracture of orbit, initial encounter [facility])    Rx / DC Orders ED Discharge Orders         Ordered    HYDROcodone-acetaminophen (NORCO/VICODIN) 5-325 MG tablet  Every 4 hours PRN     02/01/20 1251           Jeannie Fend, PA-C 02/01/20 1255    Pricilla Loveless, MD 02/04/20 [phone]

## 2020-02-01 NOTE — Discharge Instructions (Signed)
Do not blow your nose. Call Dr. Lucky Rathke office tomorrow morning to schedule an appointment. Apply cold compress to area for 20 minutes at a time to help with pain and swelling. Take Norco as needed as prescribed for pain, this can cause constipation, take Colace if needed.  Do not drive until you know how Norco will affect you.

## 2020-02-05 ENCOUNTER — Other Ambulatory Visit: Payer: Self-pay | Admitting: Otolaryngology

## 2020-02-11 ENCOUNTER — Other Ambulatory Visit: Payer: Self-pay

## 2020-02-11 ENCOUNTER — Encounter (HOSPITAL_COMMUNITY): Payer: Self-pay | Admitting: Otolaryngology

## 2020-02-11 ENCOUNTER — Other Ambulatory Visit (HOSPITAL_COMMUNITY)
Admission: RE | Admit: 2020-02-11 | Discharge: 2020-02-11 | Disposition: A | Discharge status: Home / Self Care | Payer: HRSA Program | Source: Ambulatory Visit | Attending: Otolaryngology | Admitting: Otolaryngology

## 2020-02-11 DIAGNOSIS — Z20822 Contact with and (suspected) exposure to covid-19: Secondary | ICD-10-CM | POA: Diagnosis not present

## 2020-02-11 DIAGNOSIS — Z01812 Encounter for preprocedural laboratory examination: Secondary | ICD-10-CM | POA: Diagnosis present

## 2020-02-11 LAB — SARS CORONAVIRUS 2 (TAT 6-24 HRS): SARS Coronavirus 2: NEGATIVE

## 2020-02-11 NOTE — Anesthesia Preprocedure Evaluation (Addendum)
Anesthesia Evaluation  Patient identified by MRN, date of birth, ID band Patient awake    Reviewed: Allergy & Precautions, NPO status , Patient's Chart, lab work & pertinent test results  History of Anesthesia Complications Negative for: history of anesthetic complications  Airway Mallampati: II  TM Distance: >3 FB Neck ROM: full    Dental  (+) Teeth Intact, Dental Advidsory Given   Pulmonary Current Smoker and Patient abstained from smoking.,    Pulmonary exam normal        Cardiovascular negative cardio ROS Normal cardiovascular exam     Neuro/Psych negative neurological ROS     GI/Hepatic negative GI ROS, Neg liver ROS,   Endo/Other  Hypothyroidism   Renal/GU negative Renal ROS     Musculoskeletal negative musculoskeletal ROS (+)   Abdominal   Peds  Hematology negative hematology ROS (+)   Anesthesia Other Findings   Reproductive/Obstetrics                           Anesthesia Physical Anesthesia Plan  ASA: II  Anesthesia Plan: General   Post-op Pain Management:    Induction: Intravenous  PONV Risk Score and Plan: 4 or greater and Ondansetron, Dexamethasone, Midazolam and Scopolamine patch - Pre-op  Airway Management Planned: Oral ETT  Additional Equipment:   Intra-op Plan:   Post-operative Plan: Extubation in OR  Informed Consent: I have reviewed the patients History and Physical, chart, labs and discussed the procedure including the risks, benefits and alternatives for the proposed anesthesia with the patient or authorized representative who has indicated his/her understanding and acceptance.     Dental Advisory Given  Plan Discussed with: Anesthesiologist and CRNA  Anesthesia Plan Comments:       Anesthesia Quick Evaluation

## 2020-02-11 NOTE — Progress Notes (Signed)
Pt denies SOB, chest pain, and being under the care of a cardiologist. Pt stated that PCP is Misty Stanley. Pt denies having a stress test, echo and cardiac cath. Pt denies recent labs. Pt made aware to stop taking  Aspirin (unless otherwise advised by surgeon), vitamins, fish oil and herbal medications. Do not take any NSAIDs ie: Ibuprofen, Advil, Naproxen (Aleve), Motrin, BC and Goody Powder. Pt reminded to quarantine. Pt verbalized understanding of all pre-op instructions.

## 2020-02-12 ENCOUNTER — Ambulatory Visit (HOSPITAL_COMMUNITY): Admitting: Anesthesiology

## 2020-02-12 ENCOUNTER — Ambulatory Visit (HOSPITAL_COMMUNITY)
Admission: RE | Admit: 2020-02-12 | Discharge: 2020-02-12 | Disposition: A | Discharge status: Home / Self Care | Attending: Otolaryngology | Admitting: Otolaryngology

## 2020-02-12 ENCOUNTER — Encounter (HOSPITAL_COMMUNITY): Payer: Self-pay | Admitting: Otolaryngology

## 2020-02-12 ENCOUNTER — Encounter (HOSPITAL_COMMUNITY)
Admission: RE | Disposition: A | Discharge status: Home / Self Care | Payer: Self-pay | Source: Home / Self Care | Attending: Otolaryngology

## 2020-02-12 DIAGNOSIS — Y9389 Activity, other specified: Secondary | ICD-10-CM | POA: Diagnosis not present

## 2020-02-12 DIAGNOSIS — F17210 Nicotine dependence, cigarettes, uncomplicated: Secondary | ICD-10-CM | POA: Insufficient documentation

## 2020-02-12 DIAGNOSIS — S0231XA Fracture of orbital floor, right side, initial encounter for closed fracture: Secondary | ICD-10-CM | POA: Insufficient documentation

## 2020-02-12 DIAGNOSIS — S02831A Fracture of medial orbital wall, right side, initial encounter for closed fracture: Secondary | ICD-10-CM | POA: Diagnosis not present

## 2020-02-12 DIAGNOSIS — Z79899 Other long term (current) drug therapy: Secondary | ICD-10-CM | POA: Insufficient documentation

## 2020-02-12 DIAGNOSIS — Y929 Unspecified place or not applicable: Secondary | ICD-10-CM | POA: Diagnosis not present

## 2020-02-12 DIAGNOSIS — E039 Hypothyroidism, unspecified: Secondary | ICD-10-CM | POA: Insufficient documentation

## 2020-02-12 DIAGNOSIS — Y990 Civilian activity done for income or pay: Secondary | ICD-10-CM | POA: Insufficient documentation

## 2020-02-12 HISTORY — DX: Fracture of orbit, unspecified, initial encounter for closed fracture: S02.85XA

## 2020-02-12 HISTORY — PX: ORIF ORBITAL FRACTURE: SHX5312

## 2020-02-12 LAB — CBC
HCT: 41.1 % (ref 36.0–46.0)
Hemoglobin: 13 g/dL (ref 12.0–15.0)
MCH: 32.2 pg (ref 26.0–34.0)
MCHC: 31.6 g/dL (ref 30.0–36.0)
MCV: 101.7 fL — ABNORMAL HIGH (ref 80.0–100.0)
Platelets: 247 10*3/uL (ref 150–400)
RBC: 4.04 MIL/uL (ref 3.87–5.11)
RDW: 12.6 % (ref 11.5–15.5)
WBC: 6.7 10*3/uL (ref 4.0–10.5)
nRBC: 0 % (ref 0.0–0.2)

## 2020-02-12 LAB — POCT PREGNANCY, URINE: Preg Test, Ur: NEGATIVE

## 2020-02-12 SURGERY — OPEN REDUCTION INTERNAL FIXATION (ORIF) ORBITAL FRACTURE
Anesthesia: General | Laterality: Right

## 2020-02-12 MED ORDER — CHLORHEXIDINE GLUCONATE 0.12 % MT SOLN
15.0000 mL | Freq: Once | OROMUCOSAL | Status: AC
  Administered 2020-02-12: 15 mL via OROMUCOSAL
  Filled 2020-02-12: qty 15

## 2020-02-12 MED ORDER — LIDOCAINE 2% (20 MG/ML) 5 ML SYRINGE
INTRAMUSCULAR | Status: DC | PRN
  Administered 2020-02-12: 100 mg via INTRAVENOUS

## 2020-02-12 MED ORDER — FENTANYL CITRATE (PF) 100 MCG/2ML IJ SOLN
25.0000 ug | INTRAMUSCULAR | Status: DC | PRN
  Administered 2020-02-12: 25 ug via INTRAVENOUS

## 2020-02-12 MED ORDER — LIDOCAINE-EPINEPHRINE 1 %-1:100000 IJ SOLN
INTRAMUSCULAR | Status: DC | PRN
  Administered 2020-02-12: 3 mL

## 2020-02-12 MED ORDER — PROPOFOL 10 MG/ML IV BOLUS
INTRAVENOUS | Status: AC
  Filled 2020-02-12: qty 20

## 2020-02-12 MED ORDER — CEFAZOLIN SODIUM-DEXTROSE 2-4 GM/100ML-% IV SOLN
2.0000 g | INTRAVENOUS | Status: AC
  Administered 2020-02-12: 2 g via INTRAVENOUS
  Filled 2020-02-12: qty 100

## 2020-02-12 MED ORDER — ARTIFICIAL TEARS OPHTHALMIC OINT
TOPICAL_OINTMENT | OPHTHALMIC | Status: DC | PRN
  Administered 2020-02-12: 1 via OPHTHALMIC

## 2020-02-12 MED ORDER — HYDROCODONE-ACETAMINOPHEN 5-325 MG PO TABS: 1.0000 | ORAL_TABLET | Freq: Four times a day (QID) | ORAL | 0 refills | Status: AC | PRN

## 2020-02-12 MED ORDER — PROMETHAZINE HCL 25 MG/ML IJ SOLN: 6.2500 mg | INTRAMUSCULAR | Status: DC | PRN

## 2020-02-12 MED ORDER — MIDAZOLAM HCL 2 MG/2ML IJ SOLN
INTRAMUSCULAR | Status: AC
  Filled 2020-02-12: qty 2

## 2020-02-12 MED ORDER — OXYMETAZOLINE HCL 0.05 % NA SOLN
NASAL | Status: DC | PRN
  Administered 2020-02-12: 1

## 2020-02-12 MED ORDER — DEXAMETHASONE SODIUM PHOSPHATE 10 MG/ML IJ SOLN
INTRAMUSCULAR | Status: DC | PRN
  Administered 2020-02-12: 10 mg via INTRAVENOUS

## 2020-02-12 MED ORDER — ONDANSETRON HCL 4 MG/2ML IJ SOLN
INTRAMUSCULAR | Status: DC | PRN
  Administered 2020-02-12: 4 mg via INTRAVENOUS

## 2020-02-12 MED ORDER — ROCURONIUM BROMIDE 10 MG/ML (PF) SYRINGE
PREFILLED_SYRINGE | INTRAVENOUS | Status: DC | PRN
  Administered 2020-02-12: 70 mg via INTRAVENOUS

## 2020-02-12 MED ORDER — FENTANYL CITRATE (PF) 100 MCG/2ML IJ SOLN
INTRAMUSCULAR | Status: AC
  Filled 2020-02-12: qty 2

## 2020-02-12 MED ORDER — FENTANYL CITRATE (PF) 250 MCG/5ML IJ SOLN
INTRAMUSCULAR | Status: AC
  Filled 2020-02-12: qty 5

## 2020-02-12 MED ORDER — ACETAMINOPHEN 500 MG PO TABS
1000.0000 mg | ORAL_TABLET | Freq: Once | ORAL | Status: AC
  Administered 2020-02-12: 1000 mg via ORAL
  Filled 2020-02-12: qty 2

## 2020-02-12 MED ORDER — PROPOFOL 10 MG/ML IV BOLUS
INTRAVENOUS | Status: DC | PRN
  Administered 2020-02-12: 150 mg via INTRAVENOUS

## 2020-02-12 MED ORDER — ORAL CARE MOUTH RINSE: 15.0000 mL | Freq: Once | OROMUCOSAL | Status: AC

## 2020-02-12 MED ORDER — LIDOCAINE-EPINEPHRINE 1 %-1:100000 IJ SOLN
INTRAMUSCULAR | Status: AC
  Filled 2020-02-12: qty 1

## 2020-02-12 MED ORDER — OXYMETAZOLINE HCL 0.05 % NA SOLN
NASAL | Status: AC
  Filled 2020-02-12: qty 30

## 2020-02-12 MED ORDER — 0.9 % SODIUM CHLORIDE (POUR BTL) OPTIME
TOPICAL | Status: DC | PRN
  Administered 2020-02-12: 1000 mL

## 2020-02-12 MED ORDER — MIDAZOLAM HCL 5 MG/5ML IJ SOLN
INTRAMUSCULAR | Status: DC | PRN
  Administered 2020-02-12: 2 mg via INTRAVENOUS

## 2020-02-12 MED ORDER — CELECOXIB 200 MG PO CAPS
200.0000 mg | ORAL_CAPSULE | Freq: Once | ORAL | Status: AC
  Administered 2020-02-12: 200 mg via ORAL
  Filled 2020-02-12: qty 1

## 2020-02-12 MED ORDER — SUGAMMADEX SODIUM 200 MG/2ML IV SOLN
INTRAVENOUS | Status: DC | PRN
Start: 2020-02-12 — End: 2020-02-12
  Administered 2020-02-12: 210.4 mg via INTRAVENOUS

## 2020-02-12 MED ORDER — FENTANYL CITRATE (PF) 100 MCG/2ML IJ SOLN
INTRAMUSCULAR | Status: DC | PRN
  Administered 2020-02-12: 50 ug via INTRAVENOUS
  Administered 2020-02-12: 100 ug via INTRAVENOUS

## 2020-02-12 MED ORDER — BACITRACIN ZINC 500 UNIT/GM EX OINT
TOPICAL_OINTMENT | CUTANEOUS | Status: AC
  Filled 2020-02-12: qty 28.35

## 2020-02-12 MED ORDER — LACTATED RINGERS IV SOLN: INTRAVENOUS | Status: DC | PRN

## 2020-02-12 MED ORDER — PHENYLEPHRINE HCL-NACL 10-0.9 MG/250ML-% IV SOLN
INTRAVENOUS | Status: DC | PRN
  Administered 2020-02-12: 50 ug/min via INTRAVENOUS

## 2020-02-12 MED ORDER — SCOPOLAMINE 1 MG/3DAYS TD PT72
1.0000 | MEDICATED_PATCH | TRANSDERMAL | Status: DC
  Administered 2020-02-12: 1.5 mg via TRANSDERMAL
  Filled 2020-02-12: qty 1

## 2020-02-12 SURGICAL SUPPLY — 48 items
BLADE CLIPPER SURG (BLADE) IMPLANT
CANISTER SUCT 3000ML PPV (MISCELLANEOUS) ×3 IMPLANT
CLEANER TIP ELECTROSURG 2X2 (MISCELLANEOUS) ×1 IMPLANT
CLOSURE WOUND 1/2 X4 (GAUZE/BANDAGES/DRESSINGS) ×1
CONFORMER OPHTHALMIC MD W/HOLE (MISCELLANEOUS) ×1 IMPLANT
COVER SURGICAL LIGHT HANDLE (MISCELLANEOUS) ×3 IMPLANT
DECANTER SPIKE VIAL GLASS SM (MISCELLANEOUS) ×3 IMPLANT
DEFOGGER ANTIFOG KIT (MISCELLANEOUS) ×2 IMPLANT
DRAPE HALF SHEET 40X57 (DRAPES) IMPLANT
ELECT COATED BLADE 2.86 ST (ELECTRODE) ×2 IMPLANT
ELECT NDL BLADE 2-5/6 (NEEDLE) ×1 IMPLANT
ELECT NEEDLE BLADE 2-5/6 (NEEDLE) IMPLANT
ELECT REM PT RETURN 9FT ADLT (ELECTROSURGICAL) ×3
ELECTRODE REM PT RTRN 9FT ADLT (ELECTROSURGICAL) ×1 IMPLANT
GLOVE BIO SURGEON STRL SZ7.5 (GLOVE) ×3 IMPLANT
GOWN STRL REUS W/ TWL LRG LVL3 (GOWN DISPOSABLE) ×2 IMPLANT
GOWN STRL REUS W/TWL LRG LVL3 (GOWN DISPOSABLE) ×6
KIT BASIN OR (CUSTOM PROCEDURE TRAY) ×3 IMPLANT
KIT TURNOVER KIT B (KITS) ×3 IMPLANT
MARKER SKIN DUAL TIP RULER LAB (MISCELLANEOUS) ×2 IMPLANT
NDL HYPO 25GX1X1/2 BEV (NEEDLE) IMPLANT
NDL PRECISIONGLIDE 27X1.5 (NEEDLE) ×1 IMPLANT
NEEDLE HYPO 25GX1X1/2 BEV (NEEDLE) ×3 IMPLANT
NEEDLE PRECISIONGLIDE 27X1.5 (NEEDLE) ×3 IMPLANT
NS IRRIG 1000ML POUR BTL (IV SOLUTION) ×3 IMPLANT
PAD ARMBOARD 7.5X6 YLW CONV (MISCELLANEOUS) ×6 IMPLANT
PENCIL SMOKE EVACUATOR (MISCELLANEOUS) ×3 IMPLANT
PLATE TRI DELTA 1.7X30 (Plate) ×2 IMPLANT
POSITIONER HEAD DONUT 9IN (MISCELLANEOUS) ×3 IMPLANT
STRIP CLOSURE SKIN 1/2X4 (GAUZE/BANDAGES/DRESSINGS) ×1 IMPLANT
SUT ETHILON 4 0 CL P 3 (SUTURE) IMPLANT
SUT ETHILON 6 0 P 1 (SUTURE) IMPLANT
SUT MNCRL AB 3-0 PS2 18 (SUTURE) ×2 IMPLANT
SUT MON AB 5-0 PS2 18 (SUTURE) IMPLANT
SUT PLAIN 6 0 TG1408 (SUTURE) ×1 IMPLANT
SUT PROLENE 6 0 PC 1 (SUTURE) IMPLANT
SUT SILK 3-0 (SUTURE)
SUT SILK 3-0 RB1 30XBRD (SUTURE)
SUT SILK 6 0 G 6 (SUTURE) IMPLANT
SUT STEEL 0 (SUTURE)
SUT STEEL 0 18XMFL TIE 17 (SUTURE) IMPLANT
SUT STEEL 2 (SUTURE) IMPLANT
SUT VICRYL 4-0 PS2 18IN ABS (SUTURE) IMPLANT
SUTURE SILK 3-0 RB1 30XBRD (SUTURE) IMPLANT
TOWEL GREEN STERILE FF (TOWEL DISPOSABLE) ×3 IMPLANT
TRAY ENT MC OR (CUSTOM PROCEDURE TRAY) ×3 IMPLANT
TRAY FOLEY MTR SLVR 14FR STAT (SET/KITS/TRAYS/PACK) IMPLANT
WATER STERILE IRR 1000ML POUR (IV SOLUTION) ×3 IMPLANT
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
## 2020-02-12 NOTE — Anesthesia Procedure Notes (Signed)
Procedure Name: Intubation Date/Time: 02/12/2020 7:44 AM Performed by: Neldon Newport, CRNA Pre-anesthesia Checklist: Timeout performed, Patient being monitored, Suction available, Patient identified and Emergency Drugs available Patient Re-evaluated:Patient Re-evaluated prior to induction Oxygen Delivery Method: Circle system utilized Preoxygenation: Pre-oxygenation with 100% oxygen Induction Type: IV induction Ventilation: Mask ventilation without difficulty Laryngoscope Size: Mac and 3 Grade View: Grade I Tube type: Oral Tube size: 7.0 mm Number of attempts: 1 Placement Confirmation: ETT inserted through vocal cords under direct vision,  positive ETCO2 and breath sounds checked- equal and bilateral Secured at: 22 cm Tube secured with: Tape Dental Injury: Teeth and Oropharynx as per pre-operative assessment

## 2020-02-12 NOTE — Anesthesia Postprocedure Evaluation (Signed)
Anesthesia Post Note  Patient: DWIGHT BURDO  Procedure(s) Performed: TRANSANTRAL REPAIR OF RIGHT ORBITAL FLOOR FRACTURE WITH ABSORBABLE ORBITAL FLOOR IMPLANT AND ENDOSCOPIES (Right )     Patient location during evaluation: PACU Anesthesia Type: General Level of consciousness: sedated Pain management: pain level controlled Vital Signs Assessment: post-procedure vital signs reviewed and stable Respiratory status: spontaneous breathing and respiratory function stable Cardiovascular status: stable Postop Assessment: no apparent nausea or vomiting Anesthetic complications: no    Last Vitals:  Vitals:   02/12/20 0930 02/12/20 0945  BP: (!) 156/84 (!) 154/82  Pulse: (!) 57 60  Resp: (!) 23 19  Temp:  36.8 C  SpO2: 100% 99%    Last Pain:  Vitals:   02/12/20 0945  TempSrc:   PainSc: 5                  Dixie Jafri DANIEL

## 2020-02-12 NOTE — Transfer of Care (Signed)
Immediate Anesthesia Transfer of Care Note  Patient: Kristin Wiley  Procedure(s) Performed: TRANSANTRAL REPAIR OF RIGHT ORBITAL FLOOR FRACTURE WITH ABSORBABLE ORBITAL FLOOR IMPLANT AND ENDOSCOPIES (Right )  Patient Location: PACU  Anesthesia Type:General  Level of Consciousness: awake, alert  and oriented  Airway & Oxygen Therapy: Patient Spontanous Breathing and Patient connected to nasal cannula oxygen  Post-op Assessment: Report given to RN, Post -op Vital signs reviewed and stable and Patient moving all extremities X 4  Post vital signs: Reviewed and stable  Last Vitals:  Vitals Value Taken Time  BP 125/69 02/12/20 0842  Temp    Pulse 79 02/12/20 0842  Resp 34 02/12/20 0842  SpO2 100 % 02/12/20 0842  Vitals shown include unvalidated device data.  Last Pain:  Vitals:   02/12/20 0613  TempSrc:   PainSc: 6       Patients Stated Pain Goal: 2 (02/12/20 4332)  Complications: No apparent anesthesia complications
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
## 2020-02-12 NOTE — Brief Op Note (Signed)
02/12/2020  8:29 AM  PATIENT:  Kristin Wiley  44 y.o. female  PRE-OPERATIVE DIAGNOSIS:  Orbital fracture  POST-OPERATIVE DIAGNOSIS:  Orbital fracture  PROCEDURE:  Procedure(s): TRANSANTRAL REPAIR OF RIGHT ORBITAL FLOOR FRACTURE WITH ABSORBABLE ORBITAL FLOOR IMPLANT AND ENDOSCOPIES (Right)  SURGEON:  Surgeon(s) and Role:    Christia Reading, MD - Primary  PHYSICIAN ASSISTANT:   ASSISTANTS: none   ANESTHESIA:   general  EBL: 25 cc  BLOOD ADMINISTERED:none  DRAINS: none   LOCAL MEDICATIONS USED:  LIDOCAINE   SPECIMEN:  No Specimen  DISPOSITION OF SPECIMEN:  N/A  COUNTS:  YES  TOURNIQUET:  * No tourniquets in log *  DICTATION: .Other Dictation: Dictation Number [id number]  PLAN OF CARE: Discharge to home after PACU  PATIENT DISPOSITION:  PACU - hemodynamically stable.   Delay start of Pharmacological VTE agent (>24hrs) due to surgical blood loss or risk of bleeding: no

## 2020-02-12 NOTE — Progress Notes (Signed)
Wasted 75 mcg of fentanyl with Georgina Snell in steri cycle

## 2020-02-13 ENCOUNTER — Encounter: Payer: Self-pay | Admitting: *Deleted

## 2020-03-13 ENCOUNTER — Encounter (HOSPITAL_COMMUNITY): Payer: Self-pay | Admitting: Emergency Medicine

## 2020-03-13 ENCOUNTER — Other Ambulatory Visit: Payer: Self-pay

## 2020-03-13 ENCOUNTER — Emergency Department (HOSPITAL_COMMUNITY): Payer: Self-pay

## 2020-03-13 ENCOUNTER — Emergency Department (HOSPITAL_COMMUNITY)
Admission: EM | Admit: 2020-03-13 | Discharge: 2020-03-13 | Disposition: A | Discharge status: Home / Self Care | Payer: Self-pay | Attending: Emergency Medicine | Admitting: Emergency Medicine

## 2020-03-13 DIAGNOSIS — E039 Hypothyroidism, unspecified: Secondary | ICD-10-CM | POA: Insufficient documentation

## 2020-03-13 DIAGNOSIS — S0231XD Fracture of orbital floor, right side, subsequent encounter for fracture with routine healing: Secondary | ICD-10-CM | POA: Insufficient documentation

## 2020-03-13 DIAGNOSIS — X58XXXA Exposure to other specified factors, initial encounter: Secondary | ICD-10-CM | POA: Insufficient documentation

## 2020-03-13 DIAGNOSIS — F17210 Nicotine dependence, cigarettes, uncomplicated: Secondary | ICD-10-CM | POA: Insufficient documentation

## 2020-03-13 DIAGNOSIS — S0292XA Unspecified fracture of facial bones, initial encounter for closed fracture: Secondary | ICD-10-CM

## 2020-03-13 DIAGNOSIS — Z79899 Other long term (current) drug therapy: Secondary | ICD-10-CM | POA: Insufficient documentation

## 2020-03-13 DIAGNOSIS — Z9889 Other specified postprocedural states: Secondary | ICD-10-CM | POA: Insufficient documentation

## 2020-03-13 LAB — I-STAT CHEM 8, ED
BUN: 6 mg/dL (ref 6–20)
Calcium, Ion: 1.15 mmol/L (ref 1.15–1.40)
Chloride: 103 mmol/L (ref 98–111)
Creatinine, Ser: 0.7 mg/dL (ref 0.44–1.00)
Glucose, Bld: 92 mg/dL (ref 70–99)
HCT: 40 % (ref 36.0–46.0)
Hemoglobin: 13.6 g/dL (ref 12.0–15.0)
Potassium: 3.9 mmol/L (ref 3.5–5.1)
Sodium: 139 mmol/L (ref 135–145)
TCO2: 26 mmol/L (ref 22–32)

## 2020-03-13 MED ORDER — FLUTICASONE PROPIONATE 50 MCG/ACT NA SUSP
2.0000 | Freq: Every day | NASAL | 0 refills | Status: DC
Start: 2020-03-13 — End: 2023-07-29

## 2020-03-13 MED ORDER — MORPHINE SULFATE (PF) 4 MG/ML IV SOLN
4.0000 mg | Freq: Once | INTRAVENOUS | Status: AC
  Administered 2020-03-13: 4 mg via INTRAVENOUS
  Filled 2020-03-13: qty 1

## 2020-03-13 MED ORDER — NAPROXEN 500 MG PO TABS
500.0000 mg | ORAL_TABLET | Freq: Two times a day (BID) | ORAL | 0 refills | Status: DC
Start: 2020-03-13 — End: 2022-05-16

## 2020-03-13 MED ORDER — IOHEXOL 300 MG/ML  SOLN
75.0000 mL | Freq: Once | INTRAMUSCULAR | Status: AC | PRN
  Administered 2020-03-13: 75 mL via INTRAVENOUS

## 2020-03-13 NOTE — Discharge Instructions (Signed)
Your imaging today showed some additional fractures that were not seen on the initial CT scan.  I spoke with Dr. Pollyann Kennedy who is the ENT on-call for Dr. Jenne Pane who performed your surgery.  These fractures do not require any additional repair and will heal up on their own in time.  Your symptoms should start to improve in time as well.  You can take naproxen twice daily with meals as needed for pain.  You can also take extra strength Tylenol additionally for pain.  Do not exceed more than 4000 mg of Tylenol daily.  You can start using fluticasone nasal spray for the nasal congestion.  Dr. Pollyann Kennedy said that you could also gently blow out your nose as needed.  Follow-up with Dr. Jenne Pane in the office this week.  Please call first thing Tuesday morning to schedule follow-up.  You can also call Dr. Wynelle Link who is the ophthalmologist on-call to help arrange for follow-up and you can let them know that you were seen in the emergency department.  Return to the emergency department if any concerning signs or symptoms develop such as fevers, persistent vision changes, worsening pain, severe facial swelling.

## 2020-03-13 NOTE — ED Triage Notes (Signed)
Pt reports she had surgery about 3 weeks ago on the R side of her face, states that she has had ongoing swelling, nasal drainage and pain to the R eye, especially when moving her eyeball. States she saw her surgeon after the procedure and reported her symptoms, was supposed to get set up with an eye doctor but has never heard anything more about it. Cannot see surgeon again until 7/14.
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
## 2020-03-13 NOTE — ED Notes (Signed)
Pt verbalized understanding of discharge instructions. Follow up care and prescriptions reviewed, pt had no further questions. 

## 2020-12-19 ENCOUNTER — Other Ambulatory Visit: Payer: Self-pay

## 2020-12-19 ENCOUNTER — Encounter (HOSPITAL_COMMUNITY): Payer: Self-pay

## 2020-12-19 ENCOUNTER — Emergency Department (HOSPITAL_COMMUNITY)
Admission: EM | Admit: 2020-12-19 | Discharge: 2020-12-19 | Disposition: A | Discharge status: Home / Self Care | Payer: Self-pay | Attending: Emergency Medicine | Admitting: Emergency Medicine

## 2020-12-19 DIAGNOSIS — R112 Nausea with vomiting, unspecified: Secondary | ICD-10-CM | POA: Insufficient documentation

## 2020-12-19 DIAGNOSIS — R197 Diarrhea, unspecified: Secondary | ICD-10-CM | POA: Insufficient documentation

## 2020-12-19 DIAGNOSIS — F17210 Nicotine dependence, cigarettes, uncomplicated: Secondary | ICD-10-CM | POA: Insufficient documentation

## 2020-12-19 DIAGNOSIS — E039 Hypothyroidism, unspecified: Secondary | ICD-10-CM | POA: Insufficient documentation

## 2020-12-19 DIAGNOSIS — R1013 Epigastric pain: Secondary | ICD-10-CM | POA: Insufficient documentation

## 2020-12-19 LAB — URINALYSIS, ROUTINE W REFLEX MICROSCOPIC
Bacteria, UA: NONE SEEN
Bilirubin Urine: NEGATIVE
Glucose, UA: 50 mg/dL — AB
Ketones, ur: NEGATIVE mg/dL
Leukocytes,Ua: NEGATIVE
Nitrite: NEGATIVE
Protein, ur: >=300 mg/dL — AB
Specific Gravity, Urine: 1.018 (ref 1.005–1.030)
pH: 5 (ref 5.0–8.0)

## 2020-12-19 LAB — CBC
HCT: 47.7 % — ABNORMAL HIGH (ref 36.0–46.0)
Hemoglobin: 16.1 g/dL — ABNORMAL HIGH (ref 12.0–15.0)
MCH: 33.9 pg (ref 26.0–34.0)
MCHC: 33.8 g/dL (ref 30.0–36.0)
MCV: 100.4 fL — ABNORMAL HIGH (ref 80.0–100.0)
Platelets: 296 10*3/uL (ref 150–400)
RBC: 4.75 MIL/uL (ref 3.87–5.11)
RDW: 13.4 % (ref 11.5–15.5)
WBC: 9.5 10*3/uL (ref 4.0–10.5)
nRBC: 0 % (ref 0.0–0.2)

## 2020-12-19 LAB — COMPREHENSIVE METABOLIC PANEL
ALT: 34 U/L (ref 0–44)
AST: 32 U/L (ref 15–41)
Albumin: 5 g/dL (ref 3.5–5.0)
Alkaline Phosphatase: 62 U/L (ref 38–126)
Anion gap: 15 (ref 5–15)
BUN: 9 mg/dL (ref 6–20)
CO2: 23 mmol/L (ref 22–32)
Calcium: 10 mg/dL (ref 8.9–10.3)
Chloride: 98 mmol/L (ref 98–111)
Creatinine, Ser: 1.16 mg/dL — ABNORMAL HIGH (ref 0.44–1.00)
GFR, Estimated: 59 mL/min — ABNORMAL LOW (ref >60)
Glucose, Bld: 180 mg/dL — ABNORMAL HIGH (ref 70–99)
Potassium: 3.7 mmol/L (ref 3.5–5.1)
Sodium: 136 mmol/L (ref 135–145)
Total Bilirubin: 1.3 mg/dL — ABNORMAL HIGH (ref 0.3–1.2)
Total Protein: 9.1 g/dL — ABNORMAL HIGH (ref 6.5–8.1)

## 2020-12-19 LAB — LIPASE, BLOOD: Lipase: 44 U/L (ref 11–51)

## 2020-12-19 LAB — I-STAT BETA HCG BLOOD, ED (MC, WL, AP ONLY): I-stat hCG, quantitative: <5 m[IU]/mL (ref <5)

## 2020-12-19 MED ORDER — MORPHINE SULFATE (PF) 2 MG/ML IV SOLN
2.0000 mg | Freq: Once | INTRAVENOUS | Status: AC
  Administered 2020-12-19: 2 mg via INTRAVENOUS
  Filled 2020-12-19: qty 1

## 2020-12-19 MED ORDER — ONDANSETRON HCL 4 MG/2ML IJ SOLN
4.0000 mg | Freq: Once | INTRAMUSCULAR | Status: AC
  Administered 2020-12-19: 4 mg via INTRAVENOUS
  Filled 2020-12-19: qty 2

## 2020-12-19 MED ORDER — ALUM & MAG HYDROXIDE-SIMETH 200-200-20 MG/5ML PO SUSP
30.0000 mL | Freq: Once | ORAL | Status: AC
  Administered 2020-12-19: 30 mL via ORAL
  Filled 2020-12-19: qty 30

## 2020-12-19 MED ORDER — ONDANSETRON 4 MG PO TBDP: ORAL_TABLET | ORAL | 0 refills | Status: DC

## 2020-12-19 MED ORDER — SODIUM CHLORIDE 0.9 % IV BOLUS
1000.0000 mL | Freq: Once | INTRAVENOUS | Status: AC
  Administered 2020-12-19: 1000 mL via INTRAVENOUS

## 2020-12-19 NOTE — Discharge Instructions (Signed)
Take imodium for diarrhea.  I am not sure why your blood pressure was still high here today.  Please follow-up with your family doctor in the office.  Please return for worsening abdominal pain if the abdominal pain comes from all over to pinpoint or if you develop a fever.

## 2020-12-19 NOTE — ED Provider Notes (Signed)
[facility] EMERGENCY DEPARTMENT Provider Note   CSN: [id number] Arrival date & time: 12/19/20  3875     History Chief Complaint  Patient presents with  . Abdominal Pain    Kristin Wiley is a 45 y.o. female.  45 yo F with a chief complaints of nausea vomiting and diarrhea.  Started yesterday.  After having some vomiting the patient's her develop upper abdominal discomfort.  Described as a burning.  Having trouble eating or drinking.  No known sick contacts no cough congestion or fever.  No urinary symptoms.  The history is provided by the patient.  Abdominal Pain Pain location:  Epigastric Pain quality: aching   Pain radiates to:  Does not radiate Pain severity:  Moderate Onset quality:  Gradual Duration:  1 day Timing:  Constant Progression:  Worsening Chronicity:  New Relieved by:  Nothing Worsened by:  Nothing Ineffective treatments:  None tried Associated symptoms: diarrhea, nausea and vomiting   Associated symptoms: no chest pain, no chills, no dysuria, no fever and no shortness of breath        Past Medical History:  Diagnosis Date  . Hypothyroidism   . Orbital fracture (HCC)    right side    There are no problems to display for this patient.   Past Surgical History:  Procedure Laterality Date  . ORIF ORBITAL FRACTURE Right 02/12/2020   Procedure: TRANSANTRAL REPAIR OF RIGHT ORBITAL FLOOR FRACTURE WITH ABSORBABLE ORBITAL FLOOR IMPLANT AND ENDOSCOPIES;  Surgeon: Christia Reading, MD;  Location: [facility] OR;  Service: ENT;  Laterality: Right;  . WISDOM TOOTH EXTRACTION       OB History    Gravida  2   Para  1   Term  1   Preterm      AB  1   Living  1     SAB      IAB  1   Ectopic      Multiple      Live Births  1           History reviewed. No pertinent family history.  Social History   Tobacco Use  . Smoking status: Current Some Day Smoker    Types: Cigarettes  . Smokeless tobacco: Never Used  Vaping Use  .  Vaping Use: Never used  Substance Use Topics  . Alcohol use: Yes    Comment: occassionally  . Drug use: Not Currently    Types: Marijuana    Home Medications Prior to Admission medications   Medication Sig Start Date End Date Taking? Authorizing Provider  ondansetron (ZOFRAN ODT) 4 MG disintegrating tablet 4mg  ODT q4 hours prn nausea/vomit 12/19/20  Yes 02/18/21, Taj Nevins, DO  fluticasone (FLONASE) 50 MCG/ACT nasal spray Place 2 sprays into both nostrils daily. 03/13/20   Fawze, Mina A, PA-C  HYDROcodone-acetaminophen (NORCO/VICODIN) 5-325 MG tablet Take 1 tablet by mouth every 4 (four) hours as needed. Patient not taking: Reported on 03/13/2020 02/01/20   02/03/20, PA-C  HYDROcodone-acetaminophen (NORCO/VICODIN) 5-325 MG tablet Take 1-2 tablets by mouth every 6 (six) hours as needed for moderate pain. Patient not taking: Reported on 03/13/2020 02/12/20 02/11/21  04/13/21, MD  levothyroxine (SYNTHROID, LEVOTHROID) 100 MCG tablet Take 100 mcg by mouth daily before breakfast.    [provider]  naproxen (NAPROSYN) 500 MG tablet Take 1 tablet (500 mg total) by mouth 2 (two) times daily with a meal. 03/13/20   05/14/20, Mina A, PA-C    Allergies  Patient has no known allergies.  Review of Systems   Review of Systems  Constitutional: Negative for chills and fever.  HENT: Negative for congestion and rhinorrhea.   Eyes: Negative for redness and visual disturbance.  Respiratory: Negative for shortness of breath and wheezing.   Cardiovascular: Negative for chest pain and palpitations.  Gastrointestinal: Positive for abdominal pain, diarrhea, nausea and vomiting.  Genitourinary: Negative for dysuria and urgency.  Musculoskeletal: Negative for arthralgias and myalgias.  Skin: Negative for pallor and wound.  Neurological: Negative for dizziness and headaches.    Physical Exam Updated Vital Signs BP (!) 210/96 (BP Location: Right Arm)   Pulse 69   Temp 98.8 F (37.1 C) (Oral)   Resp 14    Ht 5' (1.524 m)   Wt 49.9 kg   LMP 12/15/2020   SpO2 100%   BMI 21.48 kg/m   Physical Exam Vitals and nursing note reviewed.  Constitutional:      General: She is not in acute distress.    Appearance: She is well-developed. She is not diaphoretic.  HENT:     Head: Normocephalic and atraumatic.  Eyes:     Pupils: Pupils are equal, round, and reactive to light.  Cardiovascular:     Rate and Rhythm: Normal rate and regular rhythm.     Heart sounds: No murmur heard. No friction rub. No gallop.   Pulmonary:     Effort: Pulmonary effort is normal.     Breath sounds: No wheezing or rales.  Abdominal:     General: There is no distension.     Palpations: Abdomen is soft.     Tenderness: There is abdominal tenderness.     Comments: Mild diffuse abdominal tenderness no noted focality.  Negative Murphy sign.  Musculoskeletal:        General: No tenderness.     Cervical back: Normal range of motion and neck supple.  Skin:    General: Skin is warm and dry.  Neurological:     Mental Status: She is alert and oriented to person, place, and time.  Psychiatric:        Behavior: Behavior normal.     ED Results / Procedures / Treatments   Labs (all labs ordered are listed, but only abnormal results are displayed) Labs Reviewed  COMPREHENSIVE METABOLIC PANEL - Abnormal; Notable for the following components:      Result Value   Glucose, Bld 180 (*)    Creatinine, Ser 1.16 (*)    Total Protein 9.1 (*)    Total Bilirubin 1.3 (*)    GFR, Estimated 59 (*)    All other components within normal limits  CBC - Abnormal; Notable for the following components:   Hemoglobin 16.1 (*)    HCT 47.7 (*)    MCV 100.4 (*)    All other components within normal limits  URINALYSIS, ROUTINE W REFLEX MICROSCOPIC - Abnormal; Notable for the following components:   APPearance CLOUDY (*)    Glucose, UA 50 (*)    Hgb urine dipstick MODERATE (*)    Protein, ur >=300 (*)    All other components within  normal limits  LIPASE, BLOOD  I-STAT BETA HCG BLOOD, ED ([facility], [facility], [facility] ONLY)    EKG None  Radiology No results found.  Procedures Procedures   Medications Ordered in ED Medications  sodium chloride 0.9 % bolus 1,000 mL (1,000 mLs Intravenous New Bag/Given 12/19/20 0755)  ondansetron (ZOFRAN) injection 4 mg (4 mg Intravenous Given 12/19/20 0755)  morphine 2 MG/ML injection 2 mg (2 mg Intravenous Given 12/19/20 0755)  alum & mag hydroxide-simeth (MAALOX/MYLANTA) 200-200-20 MG/5ML suspension 30 mL (30 mLs Oral Given 12/19/20 9024)    ED Course  I have reviewed the triage vital signs and the nursing notes.  Pertinent labs & imaging results that were available during my care of the patient were reviewed by me and considered in my medical decision making (see chart for details).    MDM Rules/Calculators/A&P                          45 yo F with a chief complaints of nausea vomiting and diarrhea.  Started yesterday afternoon.  We will check lab work give a bolus of IV fluids oral trial.  Patient is feeling better able to tolerate p.o. without issue here.  Of note she is hypertensive and has not been noted to be hypertensive in the past.  I am unsure of the etiology of this.  She is tolerating p.o. and looking better I will discharge the patient home and have her follow-up with the family doctor in the office for blood pressure recheck within the week.  9:13 AM:  I have discussed the diagnosis/risks/treatment options with the patient and believe the pt to be eligible for discharge home to follow-up with PCP. We also discussed returning to the ED immediately if new or worsening sx occur. We discussed the sx which are most concerning (e.g., sudden worsening pain, fever, inability to tolerate by mouth) that necessitate immediate return. Medications administered to the patient during their visit and any new prescriptions provided to the patient are listed below.  Medications given during this  visit Medications  sodium chloride 0.9 % bolus 1,000 mL (1,000 mLs Intravenous New Bag/Given 12/19/20 0755)  ondansetron (ZOFRAN) injection 4 mg (4 mg Intravenous Given 12/19/20 0755)  morphine 2 MG/ML injection 2 mg (2 mg Intravenous Given 12/19/20 0755)  alum & mag hydroxide-simeth (MAALOX/MYLANTA) 200-200-20 MG/5ML suspension 30 mL (30 mLs Oral Given 12/19/20 0755)     The patient appears reasonably screen and/or stabilized for discharge and I doubt any other medical condition or other [facility] requiring further screening, evaluation, or treatment in the ED at this time prior to discharge.   Final Clinical Impression(s) / ED Diagnoses Final diagnoses:  Nausea vomiting and diarrhea    Rx / DC Orders ED Discharge Orders         Ordered    ondansetron (ZOFRAN ODT) 4 MG disintegrating tablet        12/19/20 0910           Melene Plan, DO 12/19/20 0973

## 2020-12-19 NOTE — ED Notes (Signed)
Dr. Adela Lank notified of b/p of 210/96, pain level of 6

## 2020-12-19 NOTE — ED Triage Notes (Signed)
Patient reports abdominal pain starting last night with associated N/V/D, also reports fever. Denies any sick contacts

## 2021-02-03 ENCOUNTER — Ambulatory Visit: Payer: Self-pay | Admitting: Psychology

## 2021-04-30 ENCOUNTER — Encounter (HOSPITAL_COMMUNITY): Payer: Self-pay | Admitting: *Deleted

## 2021-04-30 ENCOUNTER — Other Ambulatory Visit: Payer: Self-pay

## 2021-04-30 ENCOUNTER — Emergency Department (HOSPITAL_COMMUNITY): Payer: Self-pay

## 2021-04-30 ENCOUNTER — Emergency Department (HOSPITAL_COMMUNITY)
Admission: EM | Admit: 2021-04-30 | Discharge: 2021-04-30 | Disposition: A | Discharge status: Home / Self Care | Payer: Self-pay | Attending: Emergency Medicine | Admitting: Emergency Medicine

## 2021-04-30 DIAGNOSIS — F17210 Nicotine dependence, cigarettes, uncomplicated: Secondary | ICD-10-CM | POA: Insufficient documentation

## 2021-04-30 DIAGNOSIS — K5730 Diverticulosis of large intestine without perforation or abscess without bleeding: Secondary | ICD-10-CM

## 2021-04-30 DIAGNOSIS — Z79899 Other long term (current) drug therapy: Secondary | ICD-10-CM | POA: Insufficient documentation

## 2021-04-30 DIAGNOSIS — R1084 Generalized abdominal pain: Secondary | ICD-10-CM

## 2021-04-30 DIAGNOSIS — R197 Diarrhea, unspecified: Secondary | ICD-10-CM

## 2021-04-30 DIAGNOSIS — E039 Hypothyroidism, unspecified: Secondary | ICD-10-CM | POA: Insufficient documentation

## 2021-04-30 DIAGNOSIS — R112 Nausea with vomiting, unspecified: Secondary | ICD-10-CM

## 2021-04-30 LAB — CBC
HCT: 42.2 % (ref 36.0–46.0)
Hemoglobin: 13.5 g/dL (ref 12.0–15.0)
MCH: 32.8 pg (ref 26.0–34.0)
MCHC: 32 g/dL (ref 30.0–36.0)
MCV: 102.4 fL — ABNORMAL HIGH (ref 80.0–100.0)
Platelets: 259 10*3/uL (ref 150–400)
RBC: 4.12 MIL/uL (ref 3.87–5.11)
RDW: 13.3 % (ref 11.5–15.5)
WBC: 9 10*3/uL (ref 4.0–10.5)
nRBC: 0 % (ref 0.0–0.2)

## 2021-04-30 LAB — COMPREHENSIVE METABOLIC PANEL
ALT: 26 U/L (ref 0–44)
AST: 31 U/L (ref 15–41)
Albumin: 4.7 g/dL (ref 3.5–5.0)
Alkaline Phosphatase: 58 U/L (ref 38–126)
Anion gap: 13 (ref 5–15)
BUN: 7 mg/dL (ref 6–20)
CO2: 24 mmol/L (ref 22–32)
Calcium: 9.7 mg/dL (ref 8.9–10.3)
Chloride: 102 mmol/L (ref 98–111)
Creatinine, Ser: 0.92 mg/dL (ref 0.44–1.00)
GFR, Estimated: >60 mL/min (ref >60)
Glucose, Bld: 154 mg/dL — ABNORMAL HIGH (ref 70–99)
Potassium: 3.9 mmol/L (ref 3.5–5.1)
Sodium: 139 mmol/L (ref 135–145)
Total Bilirubin: 0.7 mg/dL (ref 0.3–1.2)
Total Protein: 8.5 g/dL — ABNORMAL HIGH (ref 6.5–8.1)

## 2021-04-30 LAB — URINALYSIS, ROUTINE W REFLEX MICROSCOPIC
Bilirubin Urine: NEGATIVE
Glucose, UA: NEGATIVE mg/dL
Ketones, ur: NEGATIVE mg/dL
Leukocytes,Ua: NEGATIVE
Nitrite: NEGATIVE
Protein, ur: 100 mg/dL — AB
Specific Gravity, Urine: 1.023 (ref 1.005–1.030)
pH: 5 (ref 5.0–8.0)

## 2021-04-30 LAB — I-STAT BETA HCG BLOOD, ED (MC, WL, AP ONLY): I-stat hCG, quantitative: <5 m[IU]/mL (ref <5)

## 2021-04-30 LAB — LIPASE, BLOOD: Lipase: 20 U/L (ref 11–51)

## 2021-04-30 MED ORDER — IOHEXOL 300 MG/ML  SOLN
100.0000 mL | Freq: Once | INTRAMUSCULAR | Status: AC | PRN
  Administered 2021-04-30: 100 mL via INTRAVENOUS

## 2021-04-30 MED ORDER — ONDANSETRON 4 MG PO TBDP: 4.0000 mg | ORAL_TABLET | Freq: Three times a day (TID) | ORAL | 0 refills | Status: DC | PRN

## 2021-04-30 MED ORDER — AMOXICILLIN-POT CLAVULANATE 875-125 MG PO TABS
1.0000 | ORAL_TABLET | Freq: Once | ORAL | Status: AC
  Administered 2021-04-30: 1 via ORAL
  Filled 2021-04-30: qty 1

## 2021-04-30 MED ORDER — HYDROCODONE-ACETAMINOPHEN 5-325 MG PO TABS: 1.0000 | ORAL_TABLET | ORAL | 0 refills | Status: DC | PRN

## 2021-04-30 MED ORDER — HYDROCODONE-ACETAMINOPHEN 5-325 MG PO TABS: 1.0000 | ORAL_TABLET | Freq: Once | ORAL | Status: DC

## 2021-04-30 MED ORDER — MORPHINE SULFATE (PF) 4 MG/ML IV SOLN
4.0000 mg | Freq: Once | INTRAVENOUS | Status: AC
Start: 2021-04-30 — End: 2021-04-30
  Administered 2021-04-30: 4 mg via INTRAVENOUS
  Filled 2021-04-30: qty 1

## 2021-04-30 MED ORDER — AMOXICILLIN-POT CLAVULANATE 875-125 MG PO TABS: 1.0000 | ORAL_TABLET | Freq: Two times a day (BID) | ORAL | 0 refills | Status: DC

## 2021-04-30 MED ORDER — DIPHENHYDRAMINE HCL 50 MG/ML IJ SOLN
12.5000 mg | Freq: Once | INTRAMUSCULAR | Status: AC
  Administered 2021-04-30: 12.5 mg via INTRAVENOUS
  Filled 2021-04-30: qty 1

## 2021-04-30 MED ORDER — ONDANSETRON HCL 4 MG/2ML IJ SOLN
4.0000 mg | Freq: Once | INTRAMUSCULAR | Status: AC
  Administered 2021-04-30: 4 mg via INTRAVENOUS
  Filled 2021-04-30: qty 2

## 2021-04-30 MED ORDER — SODIUM CHLORIDE 0.9 % IV BOLUS
1000.0000 mL | Freq: Once | INTRAVENOUS | Status: AC
  Administered 2021-04-30: 1000 mL via INTRAVENOUS

## 2021-04-30 MED ORDER — HALOPERIDOL LACTATE 5 MG/ML IJ SOLN
5.0000 mg | Freq: Once | INTRAMUSCULAR | Status: AC
  Administered 2021-04-30: 5 mg via INTRAVENOUS
  Filled 2021-04-30: qty 1

## 2021-04-30 NOTE — ED Provider Notes (Addendum)
[facility] EMERGENCY DEPARTMENT Provider Note   CSN: [id number] Arrival date & time: 04/30/21  2001     History Chief Complaint  Patient presents with   Abdominal Pain    Kristin Wiley is a 45 y.o. female.  45 year old female with history of hypothyroidism presents with complaint of generalized abdominal crampy pain with nausea, vomiting, diarrhea.  Symptoms started last night, reports numerous episodes of nonbloody nonbilious emesis today.  Stools described as loose, nonbloody.  No known sick contacts, no recent travel, no recent antibiotics.  History of 1 similar episode in the past year, unsure cause or outcome.  Denies fevers or chills.  No prior abdominal surgeries.  No other complaints or concerns.      Past Medical History:  Diagnosis Date   Hypothyroidism    Orbital fracture (HCC)    right side    There are no problems to display for this patient.   Past Surgical History:  Procedure Laterality Date   ORIF ORBITAL FRACTURE Right 02/12/2020   Procedure: TRANSANTRAL REPAIR OF RIGHT ORBITAL FLOOR FRACTURE WITH ABSORBABLE ORBITAL FLOOR IMPLANT AND ENDOSCOPIES;  Surgeon: Christia Reading, MD;  Location: [facility] OR;  Service: ENT;  Laterality: Right;   WISDOM TOOTH EXTRACTION       OB History     Gravida  2   Para  1   Term  1   Preterm      AB  1   Living  1      SAB      IAB  1   Ectopic      Multiple      Live Births  1           No family history on file.  Social History   Tobacco Use   Smoking status: Some Days    Types: Cigarettes   Smokeless tobacco: Never  Vaping Use   Vaping Use: Never used  Substance Use Topics   Alcohol use: Yes    Comment: occassionally   Drug use: Not Currently    Types: Marijuana    Home Medications Prior to Admission medications   Medication Sig Start Date End Date Taking? Authorizing Provider  amoxicillin-clavulanate (AUGMENTIN) 875-125 MG tablet Take 1 tablet by mouth every 12 (twelve)  hours. 04/30/21  Yes Jeannie Fend, PA-C  HYDROcodone-acetaminophen (NORCO/VICODIN) 5-325 MG tablet Take 1 tablet by mouth every 4 (four) hours as needed. 04/30/21  Yes Jeannie Fend, PA-C  ondansetron (ZOFRAN ODT) 4 MG disintegrating tablet Take 1 tablet (4 mg total) by mouth every 8 (eight) hours as needed for nausea or vomiting. 04/30/21  Yes Jeannie Fend, PA-C  fluticasone (FLONASE) 50 MCG/ACT nasal spray Place 2 sprays into both nostrils daily. 03/13/20   Fawze, Mina A, PA-C  levothyroxine (SYNTHROID, LEVOTHROID) 100 MCG tablet Take 100 mcg by mouth daily before breakfast.    [provider]  naproxen (NAPROSYN) 500 MG tablet Take 1 tablet (500 mg total) by mouth 2 (two) times daily with a meal. 03/13/20   Luevenia Maxin, Mina A, PA-C    Allergies    Patient has no known allergies.  Review of Systems   Review of Systems  Constitutional:  Negative for chills and fever.  Respiratory:  Negative for shortness of breath.   Cardiovascular:  Negative for chest pain.  Gastrointestinal:  Positive for abdominal pain, diarrhea, nausea and vomiting. Negative for blood in stool and constipation.  Genitourinary:  Negative for difficulty urinating and  dysuria.  Musculoskeletal:  Negative for arthralgias and myalgias.  Skin:  Negative for rash and wound.  Allergic/Immunologic: Negative for immunocompromised state.  Neurological:  Negative for weakness.  Hematological:  Negative for adenopathy.  Psychiatric/Behavioral:  Negative for confusion.   All other systems reviewed and are negative.  Physical Exam Updated Vital Signs BP (!) 207/92   Pulse (!) 103   Temp 98.2 F (36.8 C) (Oral)   Resp 16   Ht 5' (1.524 m)   Wt 49.9 kg   LMP 04/18/2021   SpO2 100%   BMI 21.48 kg/m   Physical Exam Vitals and nursing note reviewed.  Constitutional:      General: She is not in acute distress.    Appearance: She is well-developed. She is not diaphoretic.  HENT:     Head: Normocephalic and  atraumatic.  Cardiovascular:     Rate and Rhythm: Normal rate and regular rhythm.     Heart sounds: Normal heart sounds.  Pulmonary:     Effort: Pulmonary effort is normal.     Breath sounds: Normal breath sounds.  Abdominal:     Palpations: Abdomen is soft.     Tenderness: There is generalized abdominal tenderness.  Skin:    General: Skin is warm and dry.     Findings: No erythema or rash.  Neurological:     Mental Status: She is alert and oriented to person, place, and time.  Psychiatric:        Behavior: Behavior normal.    ED Results / Procedures / Treatments   Labs (all labs ordered are listed, but only abnormal results are displayed) Labs Reviewed  COMPREHENSIVE METABOLIC PANEL - Abnormal; Notable for the following components:      Result Value   Glucose, Bld 154 (*)    Total Protein 8.5 (*)    All other components within normal limits  CBC - Abnormal; Notable for the following components:   MCV 102.4 (*)    All other components within normal limits  URINALYSIS, ROUTINE W REFLEX MICROSCOPIC - Abnormal; Notable for the following components:   APPearance HAZY (*)    Hgb urine dipstick SMALL (*)    Protein, ur 100 (*)    Bacteria, UA RARE (*)    All other components within normal limits  LIPASE, BLOOD  I-STAT BETA HCG BLOOD, ED ([facility], [facility], [facility] ONLY)    EKG None  Radiology CT Abdomen Pelvis W Contrast  Result Date: 04/30/2021 CLINICAL DATA:  Abdominal pain. EXAM: CT ABDOMEN AND PELVIS WITH CONTRAST TECHNIQUE: Multidetector CT imaging of the abdomen and pelvis was performed using the standard protocol following bolus administration of intravenous contrast. CONTRAST:  OMNIPAQUE IOHEXOL 300 MG/ML  SOLN COMPARISON:  CT of the abdomen pelvis dated 09/30/2016. FINDINGS: Lower chest: The visualized lung bases are clear. Intra-abdominal free air or free fluid. Hepatobiliary: No focal liver abnormality is seen. No gallstones, gallbladder wall thickening, or biliary  dilatation. Pancreas: Unremarkable. No pancreatic ductal dilatation or surrounding inflammatory changes. Spleen: Normal in size without focal abnormality. Adrenals/Urinary Tract: The adrenal glands unremarkable. The kidneys, visualized ureters, and urinary bladder appear unremarkable. Stomach/Bowel: Several small scattered colonic diverticula without active inflammatory changes. There is no bowel obstruction or active inflammation. The appendix is normal. Vascular/Lymphatic: Mild aortoiliac atherosclerotic disease. The IVC is unremarkable. No portal venous gas. There is no adenopathy. Reproductive: The uterus is anteverted and grossly unremarkable. No adnexal masses. Other: None Musculoskeletal: No acute or significant osseous findings. IMPRESSION: 1. No  acute intra-abdominal or pelvic pathology. 2. Small scattered colonic diverticula. No bowel obstruction. Normal appendix. 3. Aortic Atherosclerosis (ICD10-I70.0). Electronically Signed   By: Elgie Collard M.D.   On: 04/30/2021 21:52    Procedures Procedures   Medications Ordered in ED Medications  sodium chloride 0.9 % bolus 1,000 mL (1,000 mLs Intravenous New Bag/Given 04/30/21 2113)  ondansetron [facility]) injection 4 mg (4 mg Intravenous Given 04/30/21 2114)  morphine 4 MG/ML injection 4 mg (4 mg Intravenous Given 04/30/21 2114)  iohexol (OMNIPAQUE) 300 MG/ML solution 100 mL (100 mLs Intravenous Contrast Given 04/30/21 2144)  amoxicillin-clavulanate (AUGMENTIN) 875-125 MG per tablet 1 tablet (1 tablet Oral Given 04/30/21 2213)  haloperidol lactate (HALDOL) injection 5 mg (5 mg Intravenous Given 04/30/21 2231)  diphenhydrAMINE (BENADRYL) injection 12.5 mg (12.5 mg Intravenous Given 04/30/21 2231)    ED Course  I have reviewed the triage vital signs and the nursing notes.  Pertinent labs & imaging results that were available during my care of the patient were reviewed by me and considered in my medical decision making (see chart for  details).  Clinical Course as of 04/30/21 2250  Sat Apr 30, 2021  [id number] 45 year old female with complaint of abdominal pain with vomiting and diarrhea as above.  On exam found to have generalized abdominal tenderness.  Lab work is reassuring including CBC with a normal white blood cell count, CMP without significant electrolyte abnormality and normal kidney function.  Urinalysis with small amount of protein in hemoglobin.  Lipase within normal limits, hCG negative. Patient's blood pressure is elevated, consistent with prior ER visits however discussed and recommend follow-up with PCP for this. CT shows small colonic diverticuli without obvious diverticulitis.  Due to symptoms of vomiting and diarrhea with abdominal pain, will cover with Augmentin.  Given Zofran and Norco for pain and vomiting.  Recommend recheck with PCP next week, given return to ER precautions. [LM]  2227 After discharge while awaiting IVF bolus, patient had additional episode of emesis. Will treat with haldol and benadryl (marijuana use possibly contributing to her symptoms today) and recheck PO status prior to dc home tonight. [LM]    Clinical Course User Index [LM] Alden Hipp   MDM Rules/Calculators/A&P                           Final Clinical Impression(s) / ED Diagnoses Final diagnoses:  Nausea vomiting and diarrhea  Generalized abdominal pain  Diverticula of colon    Rx / DC Orders ED Discharge Orders          Ordered    amoxicillin-clavulanate (AUGMENTIN) 875-125 MG tablet  Every 12 hours        04/30/21 2203    ondansetron (ZOFRAN ODT) 4 MG disintegrating tablet  Every 8 hours PRN        04/30/21 2203    HYDROcodone-acetaminophen (NORCO/VICODIN) 5-325 MG tablet  Every 4 hours PRN        04/30/21 2203             Jeannie Fend, PA-C 04/30/21 2213    Jeannie Fend, PA-C 04/30/21 2250    Wynetta Fines, MD 05/04/21 1455

## 2021-04-30 NOTE — ED Triage Notes (Signed)
Abd pain nauseda vomiting with diarrhea since last night  lmp aug 7th

## 2021-04-30 NOTE — Discharge Instructions (Addendum)
Recheck with your primary care provider next week, a referral to primary care has been provided if needed.  Return to the emergency room for worsening or concerning symptoms.  Take Augmentin as prescribed and complete the full course.  Take Zofran as needed as prescribed for nausea and vomiting.  Take Norco as needed as prescribed for pain.  Do not drive or operate machinery if taking Norco.

## 2021-05-03 NOTE — ED Notes (Signed)
Called for triage vitals x1

## 2021-10-22 IMAGING — CT CT MAXILLOFACIAL W/ CM
3 series · 14 of 47 positions shown, 16 images · IV contrast (omnipaque)
Comparison: Orbit CT 02/01/2020.

CLINICAL DATA: 44-year-old female status post right facial surgery
about 3 weeks ago with persistent swelling, nasal drainage, right
eye pain. Right orbital wall fractures on 02/01/2020.

EXAM:
CT MAXILLOFACIAL WITH CONTRAST
TECHNIQUE: Multidetector CT imaging of the maxillofacial structures was
performed with intravenous contrast. Multiplanar CT image
reconstructions were also generated.
CONTRAST:  75mL OMNIPAQUE IOHEXOL 300 MG/ML  SOLN

[Series 3: facialbone 2.0 st · axial · 0.37mm/px · z∈[-258,-134]mm · 8 of 74 slices shown, 10 images]
[im 6/74  brain]
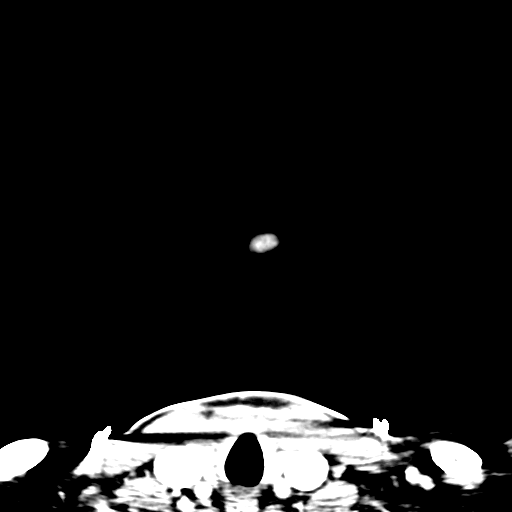
[im 6/74  bone]
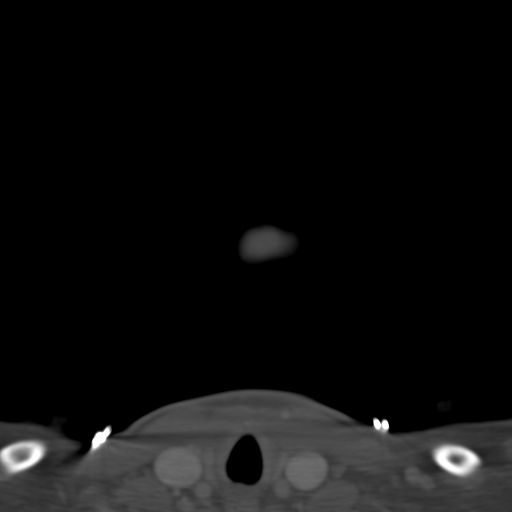
[im 16/74  bone]
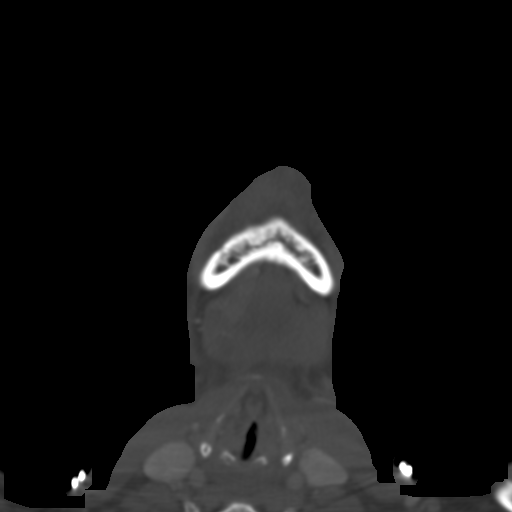
[im 23/74  bone]
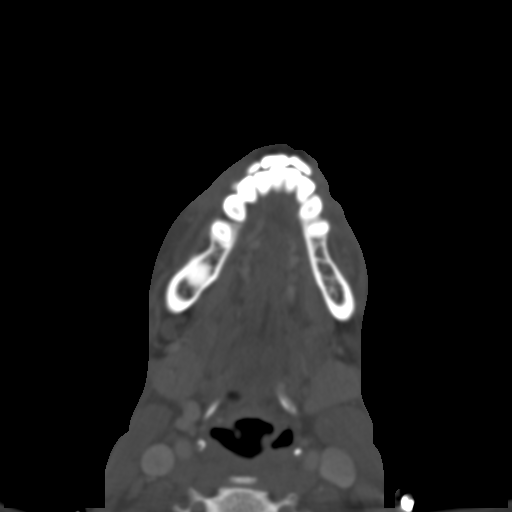
[im 33/74  bone]
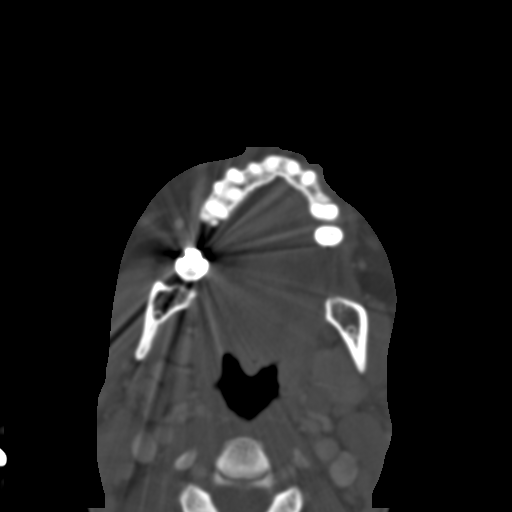
[im 41/74  brain]
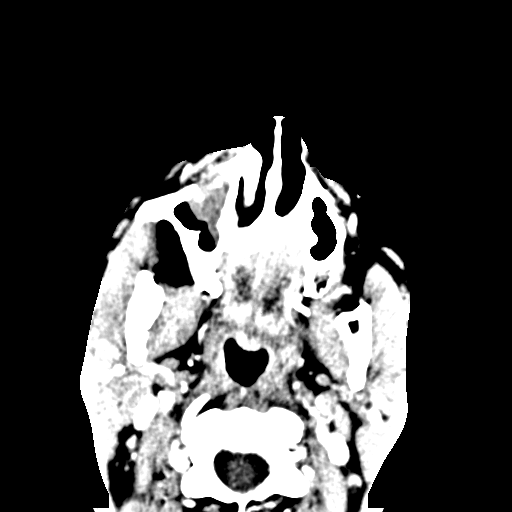
[im 41/74  bone]
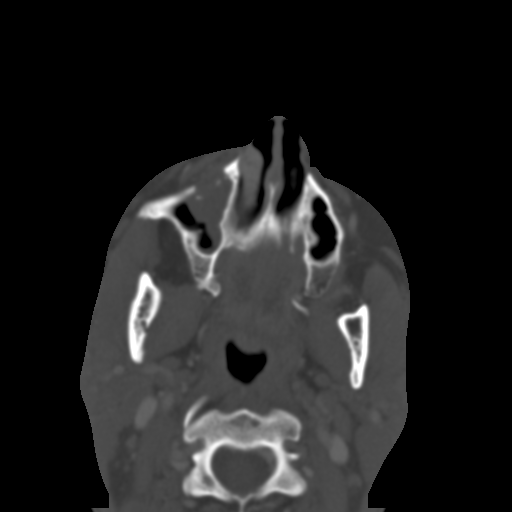
[im 51/74  bone]
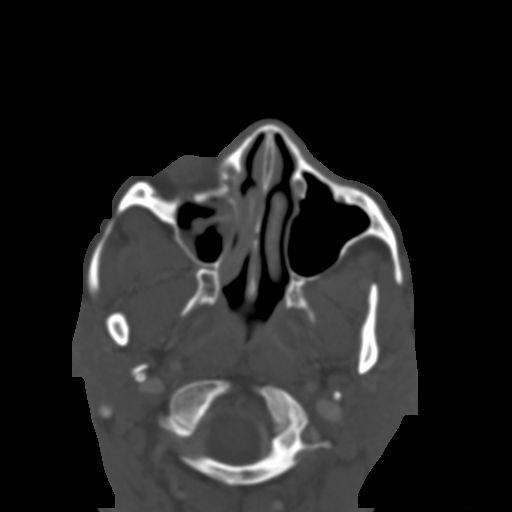
[im 58/74  bone]
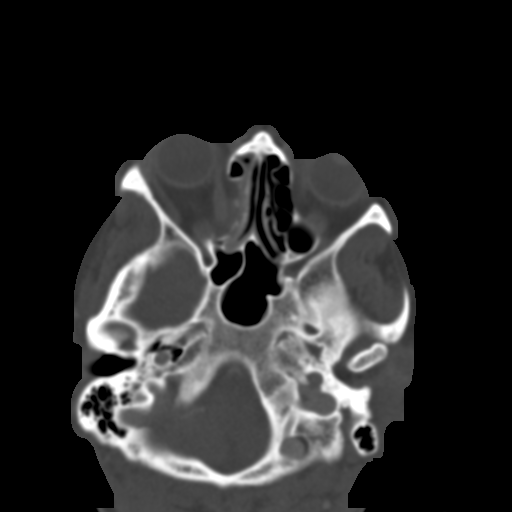
[im 68/74  bone]
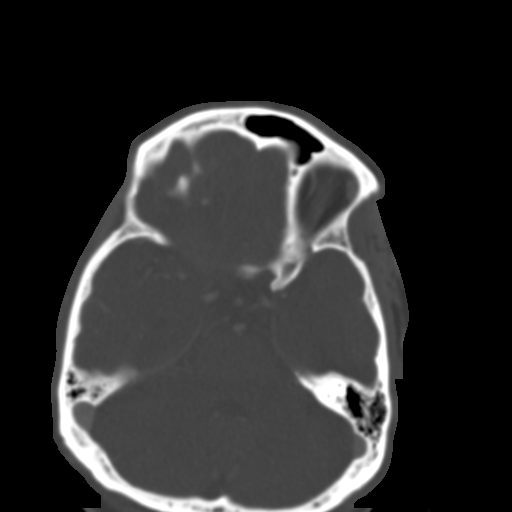

[Series 7: facialbone 2.0 cor st · coronal · 0.31mm/px · 3 of 74 slices shown]
[im 25/74  bone]
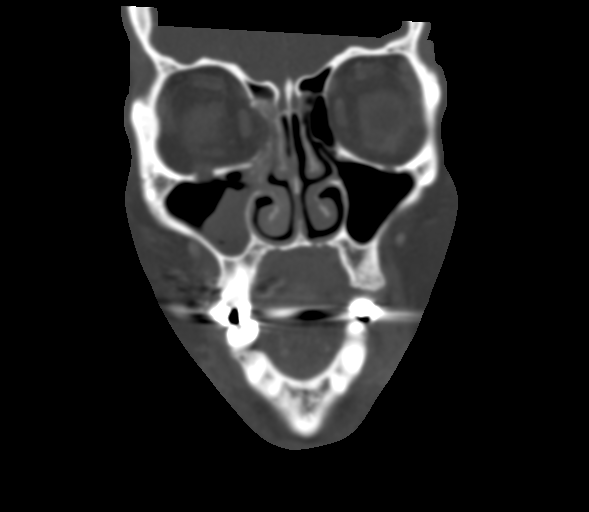
[im 33/74  bone]
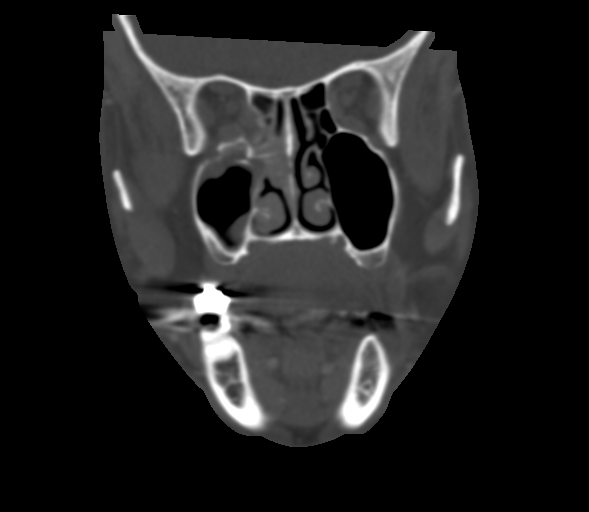
[im 41/74  bone]
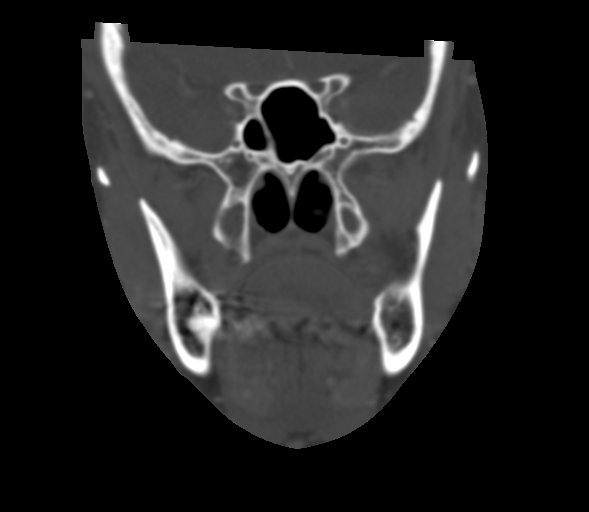

[Series 8: facialbone 2.0 sag st · sagittal · 0.34mm/px · 3 of 76 slices shown]
[im 26/76  bone]
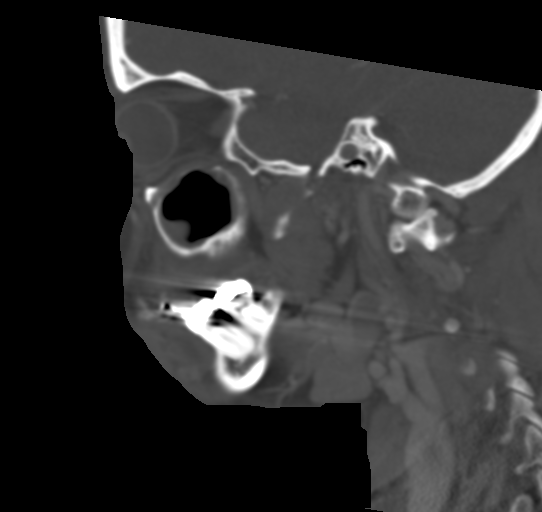
[im 38/76  bone]
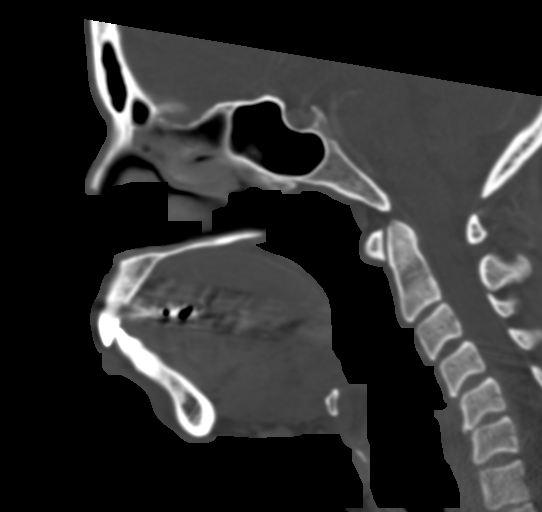
[im 51/76  bone]
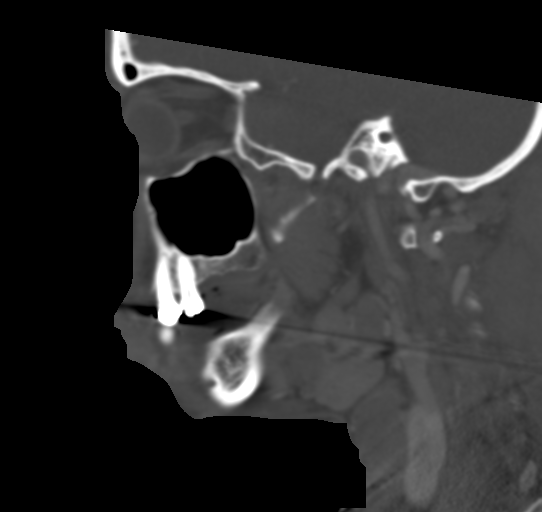

[14 of 47 positions shown; findings below may reference images not displayed]

FINDINGS: Osseous: Mandible remains intact and normally located. Aside from
the orbital walls described below, there is also a comminuted
fracture of the anterior inferior wall of the right maxillary sinus
(series 4, image 40). However, the right maxilla alveolar process
appear spared. No acute dental finding identified.

Zygoma, nasal bones, central skull base and visible cervical
vertebrae appear intact.

Orbits: Intact left orbital walls.  Normal left orbits soft tissues.

Highly comminuted fracture of the inferomedial right orbital floor,
primarily medial to the course of the right infraorbital nerve, with
displaced fragments but relatively mild herniated intraorbital fat
(series 5, image 31). Adjacent right lamina papyracea fracture
(series 4, image 57). The superior and lateral right orbital walls
remain intact.

Still there is only mild inflammation or hematoma identified in the
inferior postseptal right orbit. The right globe and other
intraorbital soft tissues appear to remain normal. And there is only
mild right preseptal and premalar space soft tissue stranding and
swelling.

Sinuses: Small displaced ossific fragments and mucosal thickening
within the right maxillary sinus. No layering blood or fluid.
Opacified right ethmoids affected by the lamina papyracea fracture.

Other bilateral paranasal sinuses are well pneumatized. Tympanic
cavities and mastoids are clear.

Soft tissues: Negative visible thyroid, larynx, pharynx,
parapharyngeal spaces, retropharyngeal space, sublingual space,
submandibular spaces, masticator and parotid spaces. No upper
cervical lymphadenopathy.

Limited intracranial: The major vascular structures in the visible
neck and at the skull base are patent and enhancing. Negative
visible brain parenchyma.
IMPRESSION: 1. In addition to the highly comminuted fracture of the medial right
orbital floor, there is a comminuted fracture of the anterior
inferior wall of the right maxillary sinus. The right maxillary
alveolus appear spared and no acute dental finding is identified.
Small displaced bone fragments into the maxillary sinus from both
injuries and sinus mucosal thickening. But no fluid level or
hemorrhage within the sinus. And only minimal herniated intraorbital
fat.

2. Associated substantial right lamina papyracea fracture. But only
minimal to mild inflammation or hematoma within the inferior
postseptal space.

## 2022-05-16 ENCOUNTER — Ambulatory Visit (HOSPITAL_COMMUNITY)
Admission: EM | Admit: 2022-05-16 | Discharge: 2022-05-16 | Disposition: A | Discharge status: Home / Self Care | Payer: PRIVATE HEALTH INSURANCE | Attending: Family Medicine | Admitting: Family Medicine

## 2022-05-16 ENCOUNTER — Other Ambulatory Visit: Payer: Self-pay

## 2022-05-16 ENCOUNTER — Encounter (HOSPITAL_COMMUNITY): Payer: Self-pay | Admitting: *Deleted

## 2022-05-16 DIAGNOSIS — J0100 Acute maxillary sinusitis, unspecified: Secondary | ICD-10-CM

## 2022-05-16 DIAGNOSIS — H04302 Unspecified dacryocystitis of left lacrimal passage: Secondary | ICD-10-CM

## 2022-05-16 MED ORDER — TOBRAMYCIN 0.3 % OP SOLN: 1.0000 [drp] | Freq: Four times a day (QID) | OPHTHALMIC | 0 refills | Status: DC

## 2022-05-16 MED ORDER — AMOXICILLIN-POT CLAVULANATE 875-125 MG PO TABS: 1.0000 | ORAL_TABLET | Freq: Two times a day (BID) | ORAL | 0 refills | Status: DC

## 2022-05-16 NOTE — ED Triage Notes (Signed)
Pt reports drainage from Lt eye . Pt reports waking up with dried drainage on eyes. Pt reports on going for 2 weeks. Pt also reports nasal drainage with hoarse throat

## 2022-05-17 NOTE — ED Provider Notes (Signed)
  [facility] CARE CENTER   [id number] 05/16/22 Arrival Time: 1730  ASSESSMENT & PLAN:  1. Acute non-recurrent maxillary sinusitis   2. Dacrocystitis, left    Begin: Meds ordered this encounter  Medications   tobramycin (TOBREX) 0.3 % ophthalmic solution    Sig: Place 1 drop into the right eye every 6 (six) hours.    Dispense:  5 mL    Refill:  0   amoxicillin-clavulanate (AUGMENTIN) 875-125 MG tablet    Sig: Take 1 tablet by mouth every 12 (twelve) hours.    Dispense:  20 tablet    Refill:  0   Discussed typical duration of symptoms. OTC symptom care as needed. Ensure adequate fluid intake and rest. May f/u here as needed.  Reviewed expectations re: course of current medical issues. Questions answered. Outlined signs and symptoms indicating need for more acute intervention. Patient verbalized understanding. After Visit Summary given.   SUBJECTIVE: History from: patient.  Kristin Wiley is a 46 y.o. female who presents with complaint of nasal congestion, post-nasal drainage, and sinus pain. Onset gradual,  2 w ago . Respiratory symptoms: none. Fever: absent. Overall normal PO intake without n/v. OTC treatment: none. Seasonal allergies: yes. History of frequent sinus infections: no. No specific aggravating or alleviating factors reported. Also reports bilateral eye irritation and thick yellow drainage; L > R x few days.  Social History   Tobacco Use  Smoking Status Some Days   Types: Cigarettes  Smokeless Tobacco Never     OBJECTIVE:  Vitals:   05/16/22 1832  BP: (!) 157/73  Pulse: 63  Resp: 18  Temp: 99.8 F (37.7 C)  SpO2: 97%     General appearance: alert; no distress HEENT: nasal congestion; clear runny nose; throat irritation secondary to post-nasal drainage; bilateral maxillary tenderness to palpation; turbinates boggy; thick yellow drainage from medial L eye over tear duct; mild swelling surrounding Neck: supple without LAD; trachea midline Lungs:  unlabored respirations, symmetrical air entry; cough: absent; no respiratory distress Skin: warm and dry Psychological: alert and cooperative; normal mood and affect  No Known Allergies  Past Medical History:  Diagnosis Date   Hypothyroidism    Orbital fracture (HCC)    right side   History reviewed. No pertinent family history. Social History   Socioeconomic History   Marital status: Married    Spouse name: Not on file   Number of children: Not on file   Years of education: Not on file   Highest education level: Not on file  Occupational History   Not on file  Tobacco Use   Smoking status: Some Days    Types: Cigarettes   Smokeless tobacco: Never  Vaping Use   Vaping Use: Never used  Substance and Sexual Activity   Alcohol use: Yes    Comment: occassionally   Drug use: Not Currently    Types: Marijuana   Sexual activity: Yes    Birth control/protection: None  Other Topics Concern   Not on file  Social History Narrative   Not on file   Social Determinants of Health   Financial Resource Strain: Not on file  Food Insecurity: Not on file  Transportation Needs: Not on file  Physical Activity: Not on file  Stress: Not on file  Social Connections: Not on file  Intimate Partner Violence: Not on file             Mardella Layman, MD 05/17/22 [phone]

## 2022-08-11 DIAGNOSIS — Z419 Encounter for procedure for purposes other than remedying health state, unspecified: Secondary | ICD-10-CM | POA: Diagnosis not present

## 2022-09-11 DIAGNOSIS — Z419 Encounter for procedure for purposes other than remedying health state, unspecified: Secondary | ICD-10-CM | POA: Diagnosis not present

## 2022-10-12 DIAGNOSIS — Z419 Encounter for procedure for purposes other than remedying health state, unspecified: Secondary | ICD-10-CM | POA: Diagnosis not present

## 2022-11-10 DIAGNOSIS — Z419 Encounter for procedure for purposes other than remedying health state, unspecified: Secondary | ICD-10-CM | POA: Diagnosis not present

## 2022-12-09 IMAGING — CT CT ABD-PELV W/ CM
2 of 5 series · 16 of 46 positions shown, 18 images · IV contrast (Omni 300)
Comparison: CT of the abdomen pelvis dated 09/30/2016.

CLINICAL DATA: Abdominal pain.

EXAM:
CT ABDOMEN AND PELVIS WITH CONTRAST
TECHNIQUE: Multidetector CT imaging of the abdomen and pelvis was performed
using the standard protocol following bolus administration of
intravenous contrast.
CONTRAST:  100mL OMNIPAQUE IOHEXOL 300 MG/ML  SOLN

[Series 3: a/p w/ 5mm · axial · 0.75mm/px · z∈[-470,-90]mm · 13 of 86 slices shown, 15 images]
[im 5/86  soft-tissue]
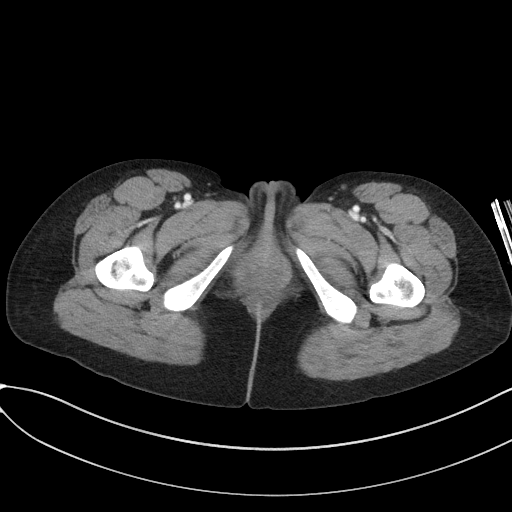
[im 5/86  bone]
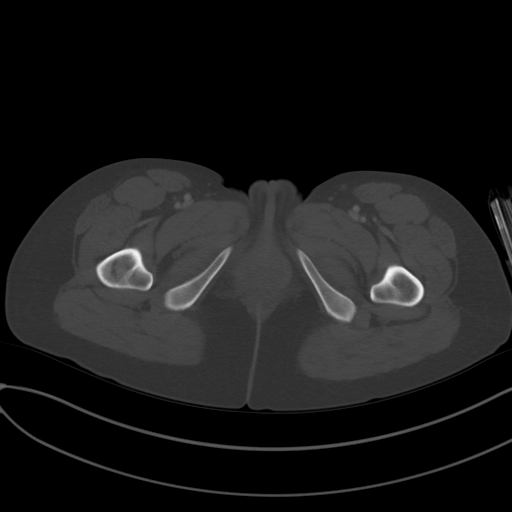
[im 14/86  soft-tissue]
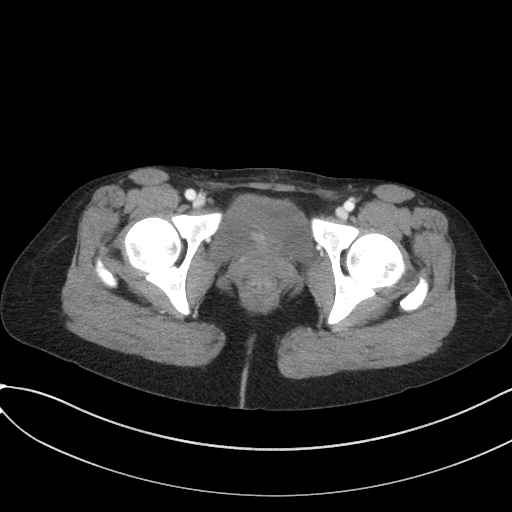
[im 18/86  soft-tissue]
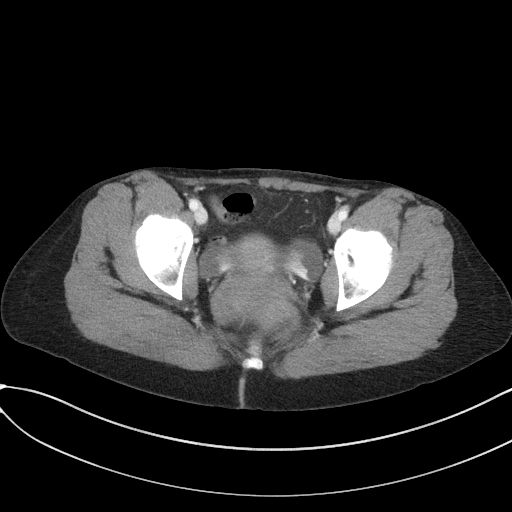
[im 23/86  soft-tissue]
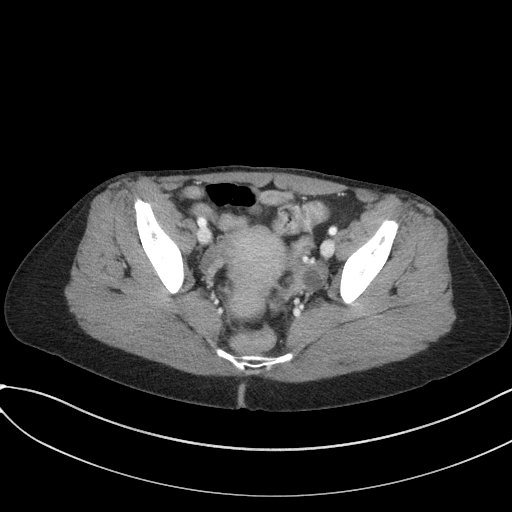
[im 32/86  soft-tissue]
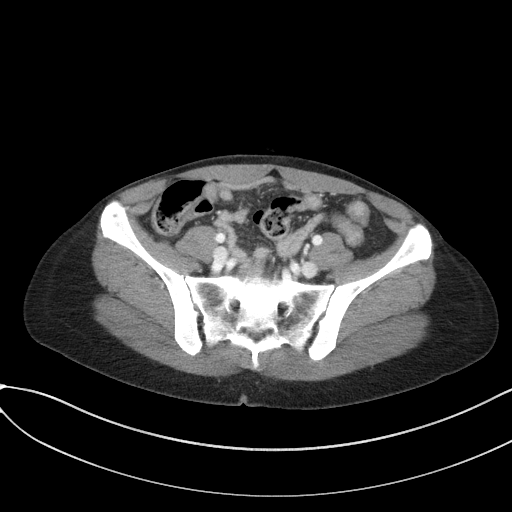
[im 36/86  soft-tissue]
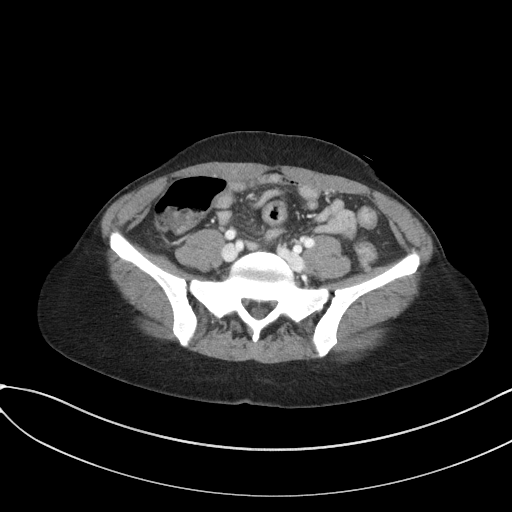
[im 45/86  soft-tissue]
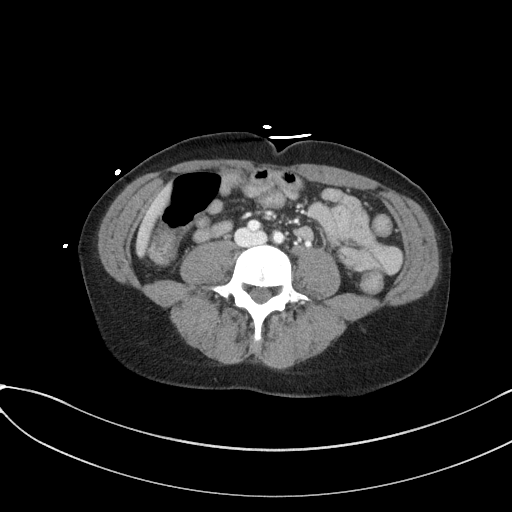
[im 50/86  soft-tissue]
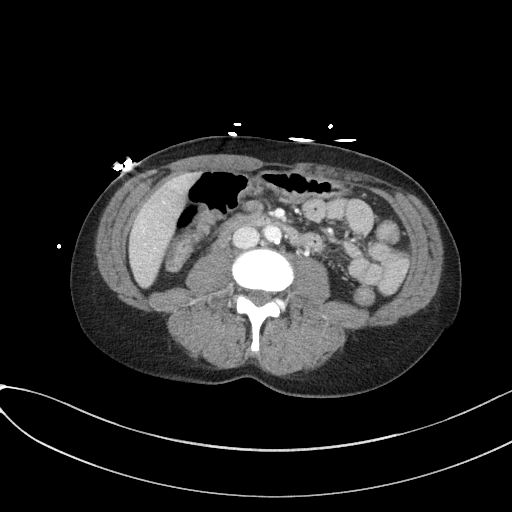
[im 54/86  soft-tissue]
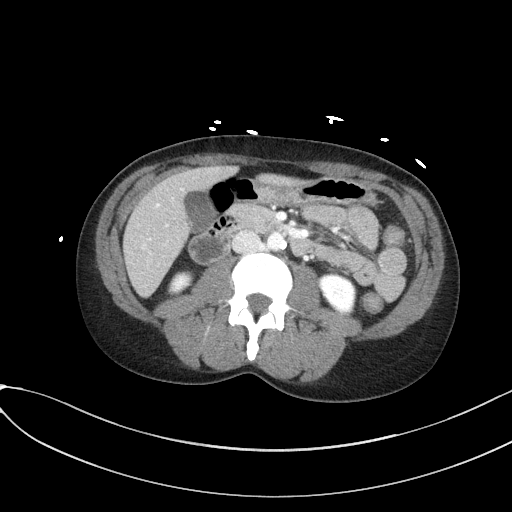
[im 54/86  bone]
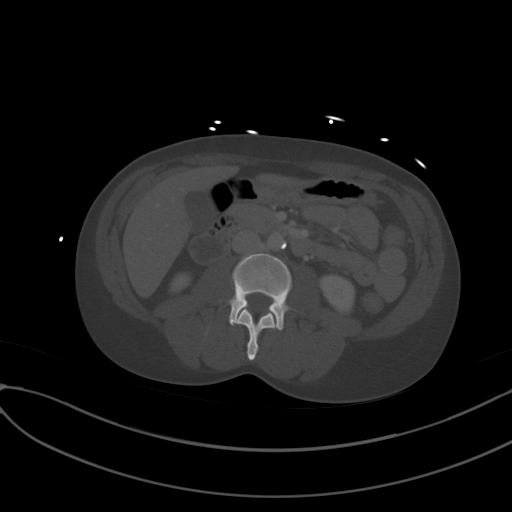
[im 63/86  soft-tissue]
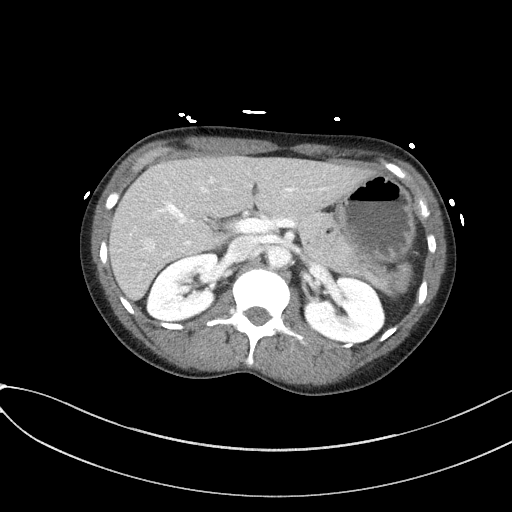
[im 68/86  soft-tissue]
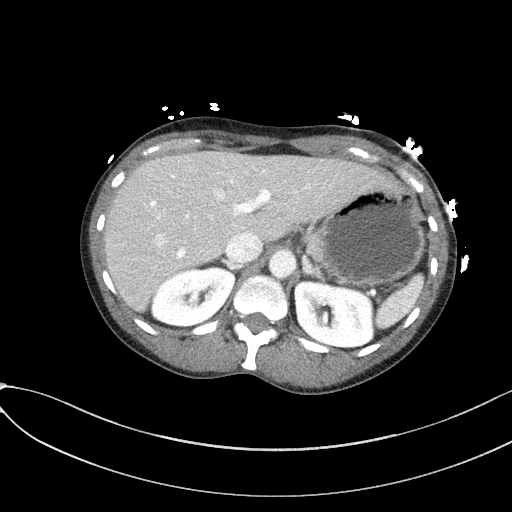
[im 72/86  soft-tissue]
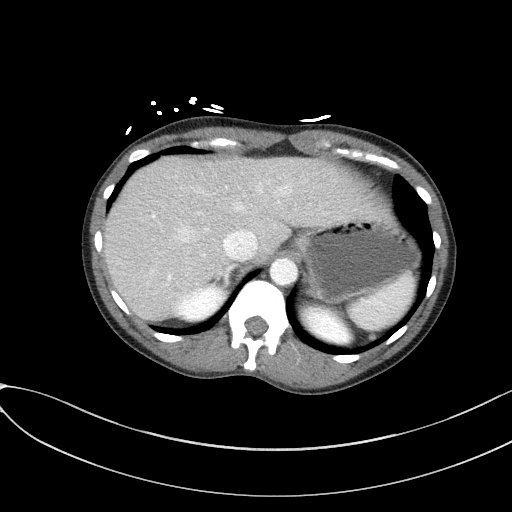
[im 81/86  soft-tissue]
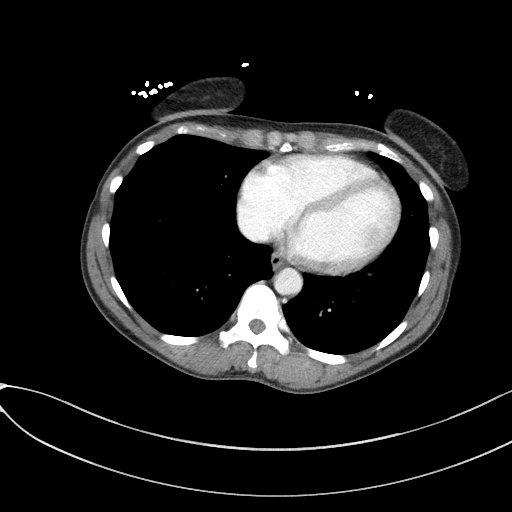

[Series 6: a/p w/ cor · coronal · 0.72mm/px · 3 of 150 slices shown]
[im 50/150  soft-tissue]
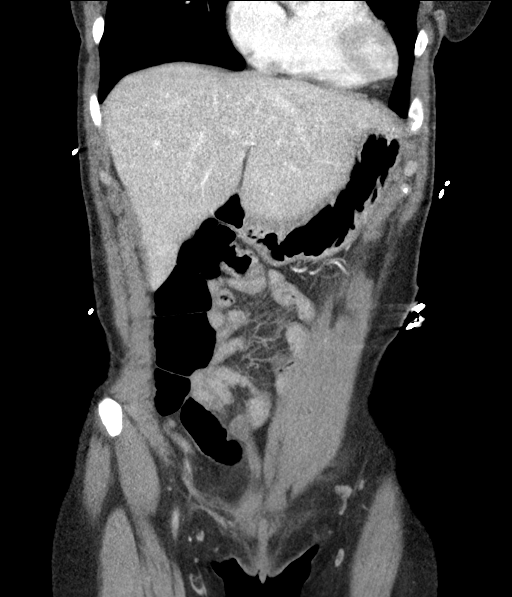
[im 67/150  soft-tissue]
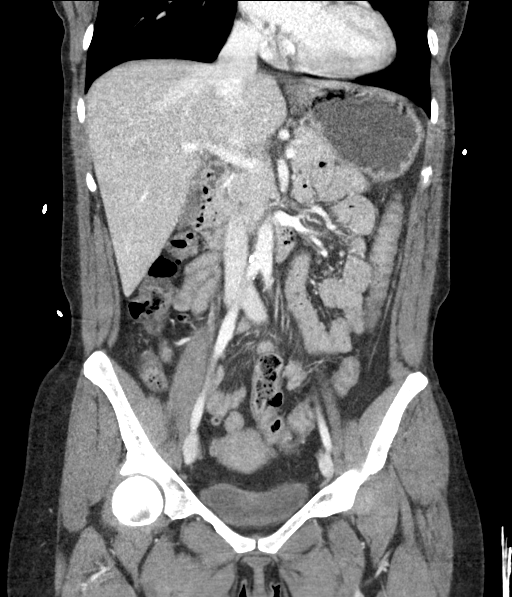
[im 83/150  soft-tissue]
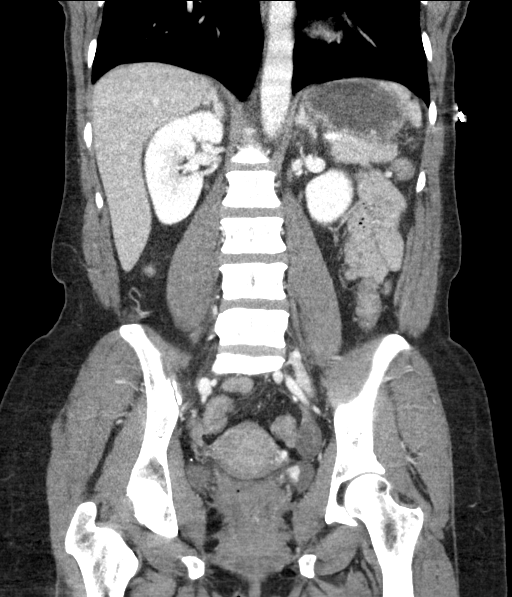

[16 of 46 positions shown; findings below may reference images not displayed]

FINDINGS: Lower chest: The visualized lung bases are clear.

Intra-abdominal free air or free fluid.

Hepatobiliary: No focal liver abnormality is seen. No gallstones,
gallbladder wall thickening, or biliary dilatation.

Pancreas: Unremarkable. No pancreatic ductal dilatation or
surrounding inflammatory changes.

Spleen: Normal in size without focal abnormality.

Adrenals/Urinary Tract: The adrenal glands unremarkable. The
kidneys, visualized ureters, and urinary bladder appear
unremarkable.

Stomach/Bowel: Several small scattered colonic diverticula without
active inflammatory changes. There is no bowel obstruction or active
inflammation. The appendix is normal.

Vascular/Lymphatic: Mild aortoiliac atherosclerotic disease. The IVC
is unremarkable. No portal venous gas. There is no adenopathy.

Reproductive: The uterus is anteverted and grossly unremarkable. No
adnexal masses.

Other: None

Musculoskeletal: No acute or significant osseous findings.
IMPRESSION: 1. No acute intra-abdominal or pelvic pathology.
2. Small scattered colonic diverticula. No bowel obstruction. Normal
appendix.
3. Aortic Atherosclerosis ([id number].X).

## 2022-12-11 DIAGNOSIS — Z419 Encounter for procedure for purposes other than remedying health state, unspecified: Secondary | ICD-10-CM | POA: Diagnosis not present

## 2023-01-03 ENCOUNTER — Telehealth: Payer: Self-pay

## 2023-01-03 NOTE — Telephone Encounter (Signed)
LVM for patient to call back to schedule apt. AS, CMA 

## 2023-01-10 DIAGNOSIS — Z419 Encounter for procedure for purposes other than remedying health state, unspecified: Secondary | ICD-10-CM | POA: Diagnosis not present

## 2023-02-10 DIAGNOSIS — Z419 Encounter for procedure for purposes other than remedying health state, unspecified: Secondary | ICD-10-CM | POA: Diagnosis not present

## 2023-04-12 DIAGNOSIS — Z419 Encounter for procedure for purposes other than remedying health state, unspecified: Secondary | ICD-10-CM | POA: Diagnosis not present

## 2023-05-13 DIAGNOSIS — Z419 Encounter for procedure for purposes other than remedying health state, unspecified: Secondary | ICD-10-CM | POA: Diagnosis not present

## 2023-06-12 DIAGNOSIS — Z419 Encounter for procedure for purposes other than remedying health state, unspecified: Secondary | ICD-10-CM | POA: Diagnosis not present

## 2023-07-29 ENCOUNTER — Other Ambulatory Visit: Payer: Self-pay

## 2023-07-29 ENCOUNTER — Emergency Department (HOSPITAL_COMMUNITY)
Admission: EM | Admit: 2023-07-29 | Discharge: 2023-07-30 | Disposition: A | Discharge status: Home / Self Care | Payer: BC Managed Care – PPO | Attending: Emergency Medicine | Admitting: Emergency Medicine

## 2023-07-29 ENCOUNTER — Encounter (HOSPITAL_COMMUNITY): Payer: Self-pay | Admitting: Emergency Medicine

## 2023-07-29 DIAGNOSIS — E039 Hypothyroidism, unspecified: Secondary | ICD-10-CM | POA: Diagnosis not present

## 2023-07-29 DIAGNOSIS — Z79899 Other long term (current) drug therapy: Secondary | ICD-10-CM | POA: Insufficient documentation

## 2023-07-29 DIAGNOSIS — T7840XA Allergy, unspecified, initial encounter: Secondary | ICD-10-CM

## 2023-07-29 DIAGNOSIS — T782XXA Anaphylactic shock, unspecified, initial encounter: Secondary | ICD-10-CM | POA: Diagnosis present

## 2023-07-29 MED ORDER — FAMOTIDINE IN NACL 20-0.9 MG/50ML-% IV SOLN
20.0000 mg | Freq: Once | INTRAVENOUS | Status: AC
  Administered 2023-07-29: 20 mg via INTRAVENOUS
  Filled 2023-07-29: qty 50

## 2023-07-29 MED ORDER — METHYLPREDNISOLONE SODIUM SUCC 125 MG IJ SOLR
125.0000 mg | Freq: Once | INTRAMUSCULAR | Status: AC
  Administered 2023-07-29: 125 mg via INTRAVENOUS
  Filled 2023-07-29: qty 2

## 2023-07-29 MED ORDER — EPINEPHRINE 0.3 MG/0.3ML IJ SOAJ
0.3000 mg | Freq: Once | INTRAMUSCULAR | Status: AC
  Administered 2023-07-29: 0.3 mg via INTRAMUSCULAR
  Filled 2023-07-29: qty 0.3

## 2023-07-29 NOTE — ED Provider Triage Note (Signed)
Emergency Medicine Provider Triage Evaluation Note  Kristin Wiley , a 47 y.o. female  was evaluated in triage.  Pt complains of allergic reaction. She reports that she was eating African food at work around 1200. She reports that she started to feel facial itching and lip swelling around 1300-1330. It has been gradually worsening since then. She tried Bendaryl at home without much relief. She reports that the lip swelling and facial swelling has worsened. She reports chest tightness and feels like she has trouble swallowing. Denies syncope.   Review of Systems  Positive:  Negative:   Physical Exam  BP (!) 153/94 (BP Location: Right Arm)   Pulse 89   Temp 98.1 F (36.7 C) (Oral)   Resp 16   SpO2 99%  Gen:   Awake, no distress   Resp:  Normal effort, diminished at the bases. No stridor.  MSK:   Moves extremities without difficulty  Other:  Swelling to bilateral lips with periorbital swelling. No urticaria present on abdomen. Patient wearing clothes, so difficult to visualize. Controlling secretions. Normal speech.   Medical Decision Making  Medically screening exam initiated at 10:06 PM.  Appropriate orders placed.  Nelta Numbers was informed that the remainder of the evaluation will be completed by another provider, this initial triage assessment does not replace that evaluation, and the importance of remaining in the ED until their evaluation is complete.  Was asked to come up here by nursing staff. Patient has swelling and swelling to bi alteral lips and swelling to peri-orbital areas. Reports itching and feels like she is having trouble swallowing and chest tightness. Denies any trouble breathing. Epi Pen ordered. Nursing staff, [place] Grose, RN that it needs to be administered NOW. Pepcid and solumedrol ordered as well. Charge Nurse alerted that the patient needs to be roomed.    Achille Rich, PA-C 07/29/23 2213

## 2023-07-29 NOTE — ED Provider Notes (Signed)
[place] EMERGENCY DEPARTMENT AT [facility] Provider Note   CSN: [id number] Arrival date & time: 07/29/23  2045     History  Chief Complaint  Patient presents with   Allergic Reaction    Kristin Wiley is a 47 y.o. female.  The history is provided by the patient and medical records. No language interpreter was used.  Allergic Reaction Presenting symptoms: swelling   Presenting symptoms: no rash   Swelling:    Location:  Face   Onset quality:  Gradual   Duration:  5 hours   Timing:  Constant   Progression:  Worsening   Chronicity:  New Severity:  Moderate Prior allergic episodes:  No prior episodes Context: food   Relieved by:  Nothing Worsened by:  Nothing Ineffective treatments:  Antihistamines      Home Medications Prior to Admission medications   Medication Sig Start Date End Date Taking? Authorizing Provider  amoxicillin-clavulanate (AUGMENTIN) 875-125 MG tablet Take 1 tablet by mouth every 12 (twelve) hours. 05/16/22   Mardella Layman, MD  fluticasone (FLONASE) 50 MCG/ACT nasal spray Place 2 sprays into both nostrils daily. 03/13/20   Fawze, Mina A, PA-C  levothyroxine (SYNTHROID, LEVOTHROID) 100 MCG tablet Take 100 mcg by mouth daily before breakfast.    [provider]  ondansetron (ZOFRAN ODT) 4 MG disintegrating tablet Take 1 tablet (4 mg total) by mouth every 8 (eight) hours as needed for nausea or vomiting. 04/30/21   Jeannie Fend, PA-C  tobramycin (TOBREX) 0.3 % ophthalmic solution Place 1 drop into the right eye every 6 (six) hours. 05/16/22   Mardella Layman, MD      Allergies    Patient has no known allergies.    Review of Systems   Review of Systems  Constitutional:  Negative for chills, fatigue and fever.  HENT:  Negative for congestion.   Eyes:  Negative for visual disturbance.  Respiratory:  Negative for cough, chest tightness and shortness of breath.   Cardiovascular:  Negative for chest pain.  Gastrointestinal:  Negative for  abdominal distention.  Genitourinary:  Negative for dysuria and flank pain.  Musculoskeletal:  Negative for back pain, neck pain and neck stiffness.  Skin:  Negative for rash and wound.  Neurological:  Negative for dizziness, speech difficulty, weakness, light-headedness and headaches.  Psychiatric/Behavioral:  Negative for agitation and confusion.   All other systems reviewed and are negative.   Physical Exam Updated Vital Signs BP (!) 153/94 (BP Location: Right Arm)   Pulse 89   Temp 98.1 F (36.7 C) (Oral)   Resp 16   Ht 5' (1.524 m)   Wt 120 kg   SpO2 99%   BMI 51.67 kg/m  Physical Exam Vitals and nursing note reviewed.  Constitutional:      General: She is not in acute distress.    Appearance: She is well-developed. She is not ill-appearing, toxic-appearing or diaphoretic.  HENT:     Head: Atraumatic.     Comments: Swelling of the face and lips.  No stridor.  No focal neurologic deficits.  No significant rash seen.    Nose: No congestion or rhinorrhea.     Mouth/Throat:     Mouth: Mucous membranes are moist.     Pharynx: No oropharyngeal exudate or posterior oropharyngeal erythema.  Eyes:     Conjunctiva/sclera: Conjunctivae normal.  Cardiovascular:     Rate and Rhythm: Normal rate and regular rhythm.     Heart sounds: No murmur heard. Pulmonary:  Effort: Pulmonary effort is normal. No respiratory distress.     Breath sounds: Normal breath sounds. No wheezing, rhonchi or rales.  Chest:     Chest wall: No tenderness.  Abdominal:     General: Abdomen is flat.     Palpations: Abdomen is soft.     Tenderness: There is no abdominal tenderness.  Musculoskeletal:        General: No swelling or tenderness.     Cervical back: Neck supple. No tenderness.  Skin:    General: Skin is warm and dry.     Capillary Refill: Capillary refill takes less than 2 seconds.     Findings: No erythema or rash.  Neurological:     General: No focal deficit present.     Mental  Status: She is alert.     Sensory: No sensory deficit.     Motor: No weakness.  Psychiatric:        Mood and Affect: Mood normal.     ED Results / Procedures / Treatments   Labs (all labs ordered are listed, but only abnormal results are displayed) Labs Reviewed - No data to display  EKG EKG Interpretation Date/Time:  Sunday July 29 2023 20:55:03 EST Ventricular Rate:  86 PR Interval:  162 QRS Duration:  74 QT Interval:  376 QTC Calculation: 449 R Axis:   95  Text Interpretation: Normal sinus rhythm Rightward axis Borderline ECG When compared with ECG of 06-Jan-2020 07:16, PREVIOUS ECG IS PRESENT when compared to prior, similar appearance. No STEMI Confirmed by Theda Belfast ([id number]) on 07/29/2023 10:33:12 PM  Radiology No results found.  Procedures Procedures    CRITICAL CARE Performed by: Canary Brim Ewel Lona Total critical care time: 20 minutes Critical care time was exclusive of separately billable procedures and treating other patients. Critical care was necessary to treat or prevent imminent or life-threatening deterioration. Critical care was time spent personally by me on the following activities: development of treatment plan with patient and/or surrogate as well as nursing, discussions with consultants, evaluation of patient's response to treatment, examination of patient, obtaining history from patient or surrogate, ordering and performing treatments and interventions, ordering and review of laboratory studies, ordering and review of radiographic studies, pulse oximetry and re-evaluation of patient's condition.  Medications Ordered in ED Medications  EPINEPHrine (EPI-PEN) injection 0.3 mg (0.3 mg Intramuscular Given 07/29/23 2213)  famotidine (PEPCID) IVPB 20 mg premix (0 mg Intravenous Stopped 07/29/23 2255)  methylPREDNISolone sodium succinate (SOLU-MEDROL) 125 mg/2 mL injection 125 mg (125 mg Intravenous Given 07/29/23 2222)    ED Course/ Medical  Decision Making/ A&P                                 Medical Decision Making   Kristin Wiley is a 47 y.o. female with a past medical history significant for hypothyroidism who presents with allergic reaction.  According to patient, she had some different foods at work today around lunchtime but then around 5 PM started having abnormal sensation in her face.  She reported facial swelling, lip swelling, scratchiness in her throat, some chest tightness, some nausea, and feeling off.  She has never had this before.  She is concerned of allergic reaction so she took some Benadryl.  It did not seem to fully resolve the symptoms so she presents for evaluation.  She reports that her lips and face seemed swollen and she does not have any history  of angioedema.  She does not take lisinopril.  Denies any other things that she is interacted with that could have caused her reaction other than some foods at work.  There was seafood present.  Patient seen in triage and had Pepcid, Solu-Medrol, and EpiPen ordered.  It was given at 2222.   On my exam, lungs were clear without wheezing.  No stridor, and chest nodes were nontender.  She reports that the symptoms seem to be plateauing or starting to improve.  She reports her throat does not feel tight or scratchy.  She still has some swelling of the lips and face but it has not worsened per her since the medications.  No focal neurologic deficits.  Symmetric smile.  Clear speech.  Pupils symmetric and reactive with normal extraocular movements.  Good pulses.  Vital signs reassuring aside from some elevated blood pressure likely due to the EpiPen.  Given her otherwise improving appearance, anticipate reassessment in several hours to make sure symptoms not worsen.  Although the lip swelling does seem somewhat like angioedema, I suspect this is anaphylaxis given the description and onset of symptoms.  Will anticipate discharge home with prescription for EpiPen and burst of  steroids and follow-up with the PCP to discuss allergy testing.  Patient agrees to this plan and is feeling better.  Care be transferred to oncoming team to wait for reassessment.  Anticipate reassessing her about 2:30 AM and if she is feeling well, anticipate discharge.              Final Clinical Impression(s) / ED Diagnoses Final diagnoses:  Allergic reaction, initial encounter  Anaphylaxis, initial encounter     Clinical Impression: 1. Allergic reaction, initial encounter   2. Anaphylaxis, initial encounter     Disposition: Care transferred to oncoming team to wait for reassessment and make sure symptoms are not worsening.  If it is stable or improved, dissipate discharge home with EpiPen and steroids.  This note was prepared with assistance of Conservation officer, historic buildings. Occasional wrong-word or sound-a-like substitutions may have occurred due to the inherent limitations of voice recognition software.     Anyelina Claycomb, Canary Brim, MD 07/29/23 2328

## 2023-07-29 NOTE — ED Triage Notes (Signed)
Pt here from home with c/o swelling to her lips that started around 5 pm after eating at work , pt took 50mg  benadryl proir to arrival , feels  some slight chest pain and sob

## 2023-07-29 NOTE — ED Provider Notes (Signed)
Patient presents with allergic reaction Improving with epipen Will monitor for 4 hrs Will need steroids/epipen at discharge   Kristin Rhine, MD 07/29/23 2327

## 2023-07-30 MED ORDER — EPINEPHRINE 0.3 MG/0.3ML IJ SOAJ: 0.3000 mg | INTRAMUSCULAR | 0 refills | Status: AC | PRN

## 2023-07-30 MED ORDER — PREDNISONE 50 MG PO TABS: ORAL_TABLET | ORAL | 0 refills | Status: DC

## 2023-07-30 NOTE — ED Provider Notes (Signed)
I advised continued monitoring, but pt is requesting D/C home at this time She reports feeling improved No tongue edema Uvula midline, no edema No stridor No wheezing Discussed at length appropriate use of epipen Referred to allergist   Zadie Rhine, MD 07/30/23 [phone]

## 2023-08-12 DIAGNOSIS — Z419 Encounter for procedure for purposes other than remedying health state, unspecified: Secondary | ICD-10-CM | POA: Diagnosis not present

## 2023-09-11 ENCOUNTER — Ambulatory Visit: Payer: Medicaid Other | Admitting: Allergy & Immunology

## 2023-09-12 DIAGNOSIS — Z419 Encounter for procedure for purposes other than remedying health state, unspecified: Secondary | ICD-10-CM | POA: Diagnosis not present

## 2023-10-13 DIAGNOSIS — Z419 Encounter for procedure for purposes other than remedying health state, unspecified: Secondary | ICD-10-CM | POA: Diagnosis not present

## 2023-11-10 DIAGNOSIS — Z419 Encounter for procedure for purposes other than remedying health state, unspecified: Secondary | ICD-10-CM | POA: Diagnosis not present

## 2023-11-13 ENCOUNTER — Encounter (HOSPITAL_COMMUNITY): Payer: Self-pay

## 2023-11-13 ENCOUNTER — Other Ambulatory Visit: Payer: Self-pay

## 2023-11-13 ENCOUNTER — Emergency Department (HOSPITAL_COMMUNITY)

## 2023-11-13 ENCOUNTER — Emergency Department (HOSPITAL_COMMUNITY)
Admission: EM | Admit: 2023-11-13 | Discharge: 2023-11-13 | Discharge status: Against Medical Advice | Attending: Emergency Medicine | Admitting: Emergency Medicine

## 2023-11-13 DIAGNOSIS — M791 Myalgia, unspecified site: Secondary | ICD-10-CM | POA: Insufficient documentation

## 2023-11-13 DIAGNOSIS — Z5321 Procedure and treatment not carried out due to patient leaving prior to being seen by health care provider: Secondary | ICD-10-CM | POA: Diagnosis not present

## 2023-11-13 DIAGNOSIS — R6883 Chills (without fever): Secondary | ICD-10-CM | POA: Insufficient documentation

## 2023-11-13 DIAGNOSIS — R0989 Other specified symptoms and signs involving the circulatory and respiratory systems: Secondary | ICD-10-CM | POA: Diagnosis not present

## 2023-11-13 DIAGNOSIS — R0981 Nasal congestion: Secondary | ICD-10-CM | POA: Diagnosis present

## 2023-11-13 LAB — RESP PANEL BY RT-PCR (RSV, FLU A&B, COVID)  RVPGX2
Influenza A by PCR: NEGATIVE
Influenza B by PCR: NEGATIVE
Resp Syncytial Virus by PCR: NEGATIVE
SARS Coronavirus 2 by RT PCR: NEGATIVE

## 2023-11-13 NOTE — ED Triage Notes (Signed)
 Pt is coming in for covid and flu like symptoms that has been going on for around 5 days, she mentions having lots of nasal and chest congestion that is accompanied with copious amounts of mucus production. No reported fevers bit mentions having body aches and chills.

## 2023-11-14 ENCOUNTER — Other Ambulatory Visit: Payer: Self-pay

## 2023-11-14 ENCOUNTER — Emergency Department (HOSPITAL_COMMUNITY)
Admission: EM | Admit: 2023-11-14 | Discharge: 2023-11-14 | Disposition: A | Discharge status: Home / Self Care | Attending: Emergency Medicine | Admitting: Emergency Medicine

## 2023-11-14 DIAGNOSIS — R0981 Nasal congestion: Secondary | ICD-10-CM | POA: Diagnosis present

## 2023-11-14 DIAGNOSIS — J019 Acute sinusitis, unspecified: Secondary | ICD-10-CM | POA: Insufficient documentation

## 2023-11-14 MED ORDER — AMOXICILLIN 500 MG PO CAPS: 500.0000 mg | ORAL_CAPSULE | Freq: Three times a day (TID) | ORAL | 0 refills | Status: DC

## 2023-11-14 NOTE — ED Triage Notes (Signed)
 Pt. Here yesterday but did not wait to see anyone from all the test results. Pt stated, Im back cause Im still having same symptoms. I want to see if they can give me an antibiotic to get rid of all of this.

## 2023-11-14 NOTE — Discharge Instructions (Addendum)
 Your respiratory panel from last night along with chest x-ray was reassuring.  No evidence of pneumonia.  No COVID, flu, RSV.  I recommend you do a sinus rinse.  This will stop the drainage and help with the cough, and congestion.  I will send in an antibiotic for you.  Keep this on hand in case she does not improve in the next 3 to 4 days after trying the sinus rinse.  For any emergent symptoms return to the emergency room.  I have also sent in [facility] for your cough.

## 2023-11-14 NOTE — ED Provider Notes (Signed)
 [name] AT [facility] Provider Note   CSN: [id number] Arrival date & time: 11/14/23  5284     History  Chief Complaint  Patient presents with   Nasal Congestion   Facial Pain   Cough    Kristin Wiley is a 48 y.o. female.  48 year old female presents today for concern of sinus congestion, postnasal drainage.  Symptoms ongoing for 7 days.  Presented to the emergency department last night but left prior to being seen due to significant wait time.  She denies chest pain, shortness of breath.  No fever.  The history is provided by the patient. No language interpreter was used.       Home Medications Prior to Admission medications   Medication Sig Start Date End Date Taking? Authorizing Provider  amoxicillin (AMOXIL) 500 MG capsule Take 1 capsule (500 mg total) by mouth 3 (three) times daily. 11/14/23  Yes Karie Mainland, Ashyr Hedgepath, PA-C  EPINEPHrine 0.3 mg/0.3 mL IJ SOAJ injection Inject 0.3 mg into the muscle as needed for anaphylaxis. 07/30/23   Zadie Rhine, MD  levothyroxine (SYNTHROID, LEVOTHROID) 100 MCG tablet Take 100 mcg by mouth daily before breakfast.    [provider]  predniSONE (DELTASONE) 50 MG tablet 1 tablet PO QD X4 days 07/30/23   Zadie Rhine, MD      Allergies    Patient has no known allergies.    Review of Systems   Review of Systems  Constitutional:  Negative for chills and fever.  HENT:  Positive for congestion and sinus pressure. Negative for sinus pain, sore throat and trouble swallowing.   Respiratory:  Positive for cough. Negative for shortness of breath.   Cardiovascular:  Negative for chest pain.  All other systems reviewed and are negative.   Physical Exam Updated Vital Signs BP (!) 154/108   Pulse 73   Temp 98.6 F (37 C) (Oral)   Resp 16   LMP 10/19/2023   SpO2 96%  Physical Exam Vitals and nursing note reviewed.  Constitutional:      General: She is not in acute distress.    Appearance: Normal  appearance. She is not ill-appearing.  HENT:     Head: Normocephalic and atraumatic.     Nose: Nose normal.  Eyes:     Conjunctiva/sclera: Conjunctivae normal.  Cardiovascular:     Rate and Rhythm: Normal rate and regular rhythm.  Pulmonary:     Effort: Pulmonary effort is normal. No respiratory distress.  Musculoskeletal:        General: No deformity. Normal range of motion.     Cervical back: Normal range of motion.  Skin:    Findings: No rash.  Neurological:     Mental Status: She is alert.     ED Results / Procedures / Treatments   Labs (all labs ordered are listed, but only abnormal results are displayed) Labs Reviewed - No data to display  EKG EKG Interpretation Date/Time:  Wednesday November 14 2023 09:31:47 EST Ventricular Rate:  64 PR Interval:  163 QRS Duration:  96 QT Interval:  428 QTC Calculation: 442 R Axis:   89  Text Interpretation: Sinus rhythm Probable left ventricular hypertrophy No significant change since prior 11/24 Confirmed by Meridee Score [phone]) on 11/14/2023 9:43:20 AM  Radiology DG Chest 2 View Result Date: 11/13/2023 CLINICAL DATA:  Chest congestion for 5 days. EXAM: CHEST - 2 VIEW COMPARISON:  01/06/2020 FINDINGS: The cardiomediastinal contours are normal. The lungs are clear. Pulmonary vasculature is  normal. No consolidation, pleural effusion, or pneumothorax. No acute osseous abnormalities are seen. IMPRESSION: No active cardiopulmonary disease. Electronically Signed   By: Narda Rutherford M.D.   On: 11/13/2023 19:55    Procedures Procedures    Medications Ordered in ED Medications - No data to display  ED Course/ Medical Decision Making/ A&P                                 Medical Decision Making Risk Prescription drug management.   48 year old female presents today for concern of sinus congestion, postnasal drainage, cough.  No fever.  Chest x-ray done last night shows no acute cardiopulmonary process.  Respiratory panel  negative.  Likely sinusitis.  Discussed likely viral.  Discussed sinus rinse.  Also provided amoxicillin to keep on hand in case it does not improve the next 3 to 4 days with sinus rinse.  She is in agreement.  Discharged in stable condition.  Return question discussed.  Patient voices understanding and is in agreement with plan.  Final Clinical Impression(s) / ED Diagnoses Final diagnoses:  Acute sinusitis, recurrence not specified, unspecified location    Rx / DC Orders ED Discharge Orders          Ordered    amoxicillin (AMOXIL) 500 MG capsule  3 times daily        11/14/23 0947              Marita Kansas, PA-C 11/14/23 0954    Terrilee Files, MD 11/14/23 [phone]

## 2023-12-22 DIAGNOSIS — Z419 Encounter for procedure for purposes other than remedying health state, unspecified: Secondary | ICD-10-CM | POA: Diagnosis not present

## 2024-01-10 DIAGNOSIS — Z23 Encounter for immunization: Secondary | ICD-10-CM | POA: Diagnosis not present

## 2024-01-11 ENCOUNTER — Other Ambulatory Visit: Payer: Self-pay | Admitting: Physician Assistant

## 2024-01-11 DIAGNOSIS — Z1231 Encounter for screening mammogram for malignant neoplasm of breast: Secondary | ICD-10-CM

## 2024-01-21 DIAGNOSIS — Z419 Encounter for procedure for purposes other than remedying health state, unspecified: Secondary | ICD-10-CM | POA: Diagnosis not present

## 2024-10-04 ENCOUNTER — Encounter (HOSPITAL_COMMUNITY): Payer: Self-pay

## 2024-10-04 ENCOUNTER — Ambulatory Visit (HOSPITAL_COMMUNITY)
Admission: EM | Admit: 2024-10-04 | Discharge: 2024-10-04 | Disposition: A | Discharge status: Home / Self Care | Attending: Family Medicine | Admitting: Family Medicine

## 2024-10-04 DIAGNOSIS — M546 Pain in thoracic spine: Secondary | ICD-10-CM | POA: Diagnosis not present

## 2024-10-04 DIAGNOSIS — J0101 Acute recurrent maxillary sinusitis: Secondary | ICD-10-CM | POA: Diagnosis not present

## 2024-10-04 MED ORDER — PREDNISONE 20 MG PO TABS: 40.0000 mg | ORAL_TABLET | Freq: Every day | ORAL | 0 refills | Status: AC

## 2024-10-04 MED ORDER — AMOXICILLIN 875 MG PO TABS: 875.0000 mg | ORAL_TABLET | Freq: Two times a day (BID) | ORAL | 0 refills | Status: AC

## 2024-10-04 MED ORDER — TIZANIDINE HCL 4 MG PO TABS: 4.0000 mg | ORAL_TABLET | Freq: Three times a day (TID) | ORAL | 0 refills | Status: AC | PRN

## 2024-10-04 NOTE — ED Triage Notes (Signed)
 Patient  c/o left eye drainage and watery, and crusty when she wakes in the morning. Patient has slight left facial swelling as well. Symptoms x 1 week.  Patient also c/o left lower back pain x 1 week, but denies any urinary symptoms.  Patient states she has been taking ibuprofen  and sinus medication.

## 2024-10-04 NOTE — Discharge Instructions (Addendum)
 Take amoxicillin  875 mg--1 tab twice daily for 7 days; this is the antibiotic  Take prednisone  20 mg--2 daily for 5 days; this is for inflammation and swelling in your sinuses  Take tizanidine  4 mg--1 every 8 hours as needed for muscle spasms; this medication can cause dizziness and sleepiness  Since she will be taking the prednisone , it would be best not to take ibuprofen  or Aleve  the next 5 days.  A warm compress to the left eye can help.  You may find that heating pad is also helpful on your sore neck and back.   SABRA

## 2024-10-04 NOTE — ED Provider Notes (Signed)
 " [facility]    CSN: [id number] Arrival date & time: 10/04/24  0847      History   Chief Complaint Chief Complaint  Patient presents with   Eye Drainage   Back Pain    HPI Kristin Wiley is a 49 y.o. female.   Here for drainage from her left eye that is mainly watery.  Has been going on for a couple weeks.  Now she is noting some pressure in her left cheek and feels that her left nostril was stopped up.  She is also noting some postnasal drainage and a little bit of cough.  No fever noted the pressure in her left cheek has been going on for about 1 week.  Her eye has not been red.  She also notes some pain in her neck and upper back for the last few days.   No trauma or fall  NKDA  Last menstrual cycle was December 26.   Past Medical History:  Diagnosis Date   Hypothyroidism    Orbital fracture (HCC)    right side    There are no active problems to display for this patient.   Past Surgical History:  Procedure Laterality Date   ORIF ORBITAL FRACTURE Right 02/12/2020   Procedure: TRANSANTRAL REPAIR OF RIGHT ORBITAL FLOOR FRACTURE WITH ABSORBABLE ORBITAL FLOOR IMPLANT AND ENDOSCOPIES;  Surgeon: Carlie Clark, MD;  Location: [facility] OR;  Service: ENT;  Laterality: Right;   WISDOM TOOTH EXTRACTION      OB History     Gravida  2   Para  1   Term  1   Preterm      AB  1   Living  1      SAB      IAB  1   Ectopic      Multiple      Live Births  1            Home Medications    Prior to Admission medications  Medication Sig Start Date End Date Taking? Authorizing Provider  amoxicillin  (AMOXIL ) 875 MG tablet Take 1 tablet (875 mg total) by mouth 2 (two) times daily for 7 days. 10/04/24 10/11/24 Yes Dinesha Twiggs, Sharlet POUR, MD  predniSONE  (DELTASONE ) 20 MG tablet Take 2 tablets (40 mg total) by mouth daily with breakfast for 5 days. 10/04/24 10/09/24 Yes Jalaysia Lobb, Sharlet POUR, MD  tiZANidine  (ZANAFLEX ) 4 MG tablet Take 1 tablet (4 mg total) by mouth  every 8 (eight) hours as needed for muscle spasms. 10/04/24  Yes Vonna Sharlet POUR, MD  EPINEPHrine  0.3 mg/0.3 mL IJ SOAJ injection Inject 0.3 mg into the muscle as needed for anaphylaxis. 07/30/23   Midge Golas, MD  levothyroxine (SYNTHROID, LEVOTHROID) 100 MCG tablet Take 100 mcg by mouth daily before breakfast.    [provider]    Family History History reviewed. No pertinent family history.  Social History Social History[1]   Allergies   Patient has no known allergies.   Review of Systems Review of Systems  Musculoskeletal:  Positive for back pain.     Physical Exam Triage Vital Signs ED Triage Vitals [10/04/24 0947]  Encounter Vitals Group     BP (!) 179/98     Girls Systolic BP Percentile      Girls Diastolic BP Percentile      Boys Systolic BP Percentile      Boys Diastolic BP Percentile      Pulse Rate 67     Resp  14     Temp 98.3 F (36.8 C)     Temp Source Oral     SpO2 95 %     Weight      Height      Head Circumference      Peak Flow      Pain Score 7     Pain Loc      Pain Education      Exclude from Growth Chart    No data found.  Updated Vital Signs BP (!) 179/98 (BP Location: Left Arm)   Pulse 67   Temp 98.3 F (36.8 C) (Oral)   Resp 14   LMP 09/05/2024 (Approximate)   SpO2 95%   Visual Acuity Right Eye Distance:   Left Eye Distance:   Bilateral Distance:    Right Eye Near:   Left Eye Near:    Bilateral Near:     Physical Exam Vitals reviewed.  Constitutional:      General: She is not in acute distress.    Appearance: She is not toxic-appearing.  HENT:     Head:     Comments: Her left cheek near the nose does look puffy.  No erythema or induration    Right Ear: Tympanic membrane and ear canal normal.     Left Ear: Tympanic membrane and ear canal normal.     Nose: Congestion present.     Mouth/Throat:     Mouth: Mucous membranes are moist.     Pharynx: No oropharyngeal exudate or posterior oropharyngeal  erythema.  Eyes:     Extraocular Movements: Extraocular movements intact.     Conjunctiva/sclera: Conjunctivae normal.     Pupils: Pupils are equal, round, and reactive to light.     Comments: The conjunctiva is normal.  The lids are not swollen.  There is a little dried thin discharge on the left lower eyelid.  Cardiovascular:     Rate and Rhythm: Normal rate and regular rhythm.     Heart sounds: No murmur heard. Pulmonary:     Effort: Pulmonary effort is normal. No respiratory distress.     Breath sounds: No wheezing, rhonchi or rales.  Chest:     Chest wall: No tenderness.  Musculoskeletal:     Cervical back: Neck supple.  Lymphadenopathy:     Cervical: No cervical adenopathy.  Skin:    Capillary Refill: Capillary refill takes less than 2 seconds.     Coloration: Skin is not jaundiced or pale.  Neurological:     General: No focal deficit present.     Mental Status: She is alert and oriented to person, place, and time.  Psychiatric:        Behavior: Behavior normal.      UC Treatments / Results  Labs (all labs ordered are listed, but only abnormal results are displayed) Labs Reviewed - No data to display  EKG   Radiology No results found.  Procedures Procedures (including critical care time)  Medications Ordered in UC Medications - No data to display  Initial Impression / Assessment and Plan / UC Course  I have reviewed the triage vital signs and the nursing notes.  Pertinent labs & imaging results that were available during my care of the patient were reviewed by me and considered in my medical decision making (see chart for details).     Amoxicillin  is sent in for left maxillary sinusitis.  I do not think this is conjunctivitis since she has no injection of the  conjunctiva.  Prednisone  is sent in for the sinusitis and for the back pain and neck pain she mentions.  Tizanidine  is sent in Final Clinical Impressions(s) / UC Diagnoses   Final diagnoses:   Acute recurrent maxillary sinusitis  Acute bilateral thoracic back pain     Discharge Instructions      Take amoxicillin  875 mg--1 tab twice daily for 7 days; this is the antibiotic  Take prednisone  20 mg--2 daily for 5 days; this is for inflammation and swelling in your sinuses  Take tizanidine  4 mg--1 every 8 hours as needed for muscle spasms; this medication can cause dizziness and sleepiness  Since she will be taking the prednisone , it would be best not to take ibuprofen  or Aleve  the next 5 days.  A warm compress to the left eye can help.  You may find that heating pad is also helpful on your sore neck and back.   .     ED Prescriptions     Medication Sig Dispense Auth. Provider   amoxicillin  (AMOXIL ) 875 MG tablet Take 1 tablet (875 mg total) by mouth 2 (two) times daily for 7 days. 14 tablet Elgar Scoggins K, MD   tiZANidine  (ZANAFLEX ) 4 MG tablet Take 1 tablet (4 mg total) by mouth every 8 (eight) hours as needed for muscle spasms. 15 tablet Aayat Hajjar K, MD   predniSONE  (DELTASONE ) 20 MG tablet Take 2 tablets (40 mg total) by mouth daily with breakfast for 5 days. 10 tablet Vonna Luan Urbani K, MD      PDMP not reviewed this encounter.    [1]  Social History Tobacco Use   Smoking status: Some Days    Types: Cigarettes   Smokeless tobacco: Never  Vaping Use   Vaping status: Never Used  Substance Use Topics   Alcohol use: Yes    Comment: occassionally   Drug use: Not Currently    Types: Marijuana     Vonna Sharlet POUR, MD 10/04/24 1016  "
